# Patient Record
Sex: Female | Born: 1975 | Race: Black or African American | Hispanic: No | Smoking: Never smoker
Health system: Southern US, Community
[De-identification: ages and names within clinical notes are randomized; demographics above are authoritative.]

## PROBLEM LIST (undated history)

## (undated) ENCOUNTER — Inpatient Hospital Stay (HOSPITAL_COMMUNITY): Payer: Self-pay

## (undated) DIAGNOSIS — Z789 Other specified health status: Secondary | ICD-10-CM

## (undated) HISTORY — PX: NO PAST SURGERIES: SHX2092

---

## 2009-09-07 ENCOUNTER — Emergency Department (HOSPITAL_COMMUNITY): Admission: EM | Admit: 2009-09-07 | Discharge: 2009-09-07 | Payer: Self-pay | Admitting: Emergency Medicine

## 2010-04-23 ENCOUNTER — Emergency Department (HOSPITAL_COMMUNITY)
Admission: EM | Admit: 2010-04-23 | Discharge: 2010-04-24 | Payer: Self-pay | Source: Home / Self Care | Admitting: Emergency Medicine

## 2010-05-01 LAB — DIFFERENTIAL
Basophils Absolute: 0 10*3/uL (ref 0.0–0.1)
Basophils Relative: 0 % (ref 0–1)
Eosinophils Absolute: 0.1 10*3/uL (ref 0.0–0.7)
Eosinophils Relative: 1 % (ref 0–5)
Lymphocytes Relative: 10 % — ABNORMAL LOW (ref 12–46)
Lymphs Abs: 1.2 10*3/uL (ref 0.7–4.0)
Monocytes Absolute: 1.4 10*3/uL — ABNORMAL HIGH (ref 0.1–1.0)
Monocytes Relative: 12 % (ref 3–12)
Neutro Abs: 8.8 10*3/uL — ABNORMAL HIGH (ref 1.7–7.7)
Neutrophils Relative %: 77 % (ref 43–77)

## 2010-05-01 LAB — POCT I-STAT, CHEM 8
BUN: 8 mg/dL (ref 6–23)
Calcium, Ion: 1.11 mmol/L — ABNORMAL LOW (ref 1.12–1.32)
Chloride: 105 mEq/L (ref 96–112)
Creatinine, Ser: 0.8 mg/dL (ref 0.4–1.2)
Glucose, Bld: 113 mg/dL — ABNORMAL HIGH (ref 70–99)
HCT: 34 % — ABNORMAL LOW (ref 36.0–46.0)
Hemoglobin: 11.6 g/dL — ABNORMAL LOW (ref 12.0–15.0)
Potassium: 3.2 mEq/L — ABNORMAL LOW (ref 3.5–5.1)
Sodium: 139 mEq/L (ref 135–145)
TCO2: 26 mmol/L (ref 0–100)

## 2010-05-01 LAB — CBC
HCT: 32.7 % — ABNORMAL LOW (ref 36.0–46.0)
Hemoglobin: 11.2 g/dL — ABNORMAL LOW (ref 12.0–15.0)
MCH: 31.4 pg (ref 26.0–34.0)
MCHC: 34.3 g/dL (ref 30.0–36.0)
MCV: 91.6 fL (ref 78.0–100.0)
Platelets: 311 10*3/uL (ref 150–400)
RBC: 3.57 MIL/uL — ABNORMAL LOW (ref 3.87–5.11)
RDW: 12.1 % (ref 11.5–15.5)
WBC: 11.4 10*3/uL — ABNORMAL HIGH (ref 4.0–10.5)

## 2010-05-01 LAB — URINALYSIS, ROUTINE W REFLEX MICROSCOPIC
Bilirubin Urine: NEGATIVE
Ketones, ur: NEGATIVE mg/dL
Nitrite: POSITIVE — AB
Protein, ur: 100 mg/dL — AB
Specific Gravity, Urine: 1.022 (ref 1.005–1.030)
Urine Glucose, Fasting: NEGATIVE mg/dL
Urobilinogen, UA: 1 mg/dL (ref 0.0–1.0)
pH: 6 (ref 5.0–8.0)

## 2010-05-01 LAB — URINE MICROSCOPIC-ADD ON

## 2010-05-01 LAB — URINE CULTURE
Colony Count: >=100000
Culture  Setup Time: 201201091230

## 2010-07-03 LAB — URINALYSIS, ROUTINE W REFLEX MICROSCOPIC
Bilirubin Urine: NEGATIVE
Glucose, UA: NEGATIVE mg/dL
Ketones, ur: NEGATIVE mg/dL
Leukocytes, UA: NEGATIVE
Nitrite: NEGATIVE
Protein, ur: NEGATIVE mg/dL
Specific Gravity, Urine: 1.013 (ref 1.005–1.030)
Urobilinogen, UA: 0.2 mg/dL (ref 0.0–1.0)
pH: 7.5 (ref 5.0–8.0)

## 2010-07-03 LAB — MALARIA SMEAR

## 2010-07-03 LAB — BASIC METABOLIC PANEL
BUN: 9 mg/dL (ref 6–23)
CO2: 24 mEq/L (ref 19–32)
Calcium: 8.7 mg/dL (ref 8.4–10.5)
Chloride: 104 mEq/L (ref 96–112)
Creatinine, Ser: 0.64 mg/dL (ref 0.4–1.2)
GFR calc Af Amer: >60 mL/min (ref >60)
GFR calc non Af Amer: >60 mL/min (ref >60)
Glucose, Bld: 107 mg/dL — ABNORMAL HIGH (ref 70–99)
Potassium: 3.7 mEq/L (ref 3.5–5.1)
Sodium: 134 mEq/L — ABNORMAL LOW (ref 135–145)

## 2010-07-03 LAB — DIFFERENTIAL
Basophils Absolute: 0 10*3/uL (ref 0.0–0.1)
Basophils Relative: 0 % (ref 0–1)
Eosinophils Absolute: 0.1 10*3/uL (ref 0.0–0.7)
Eosinophils Relative: 1 % (ref 0–5)
Lymphocytes Relative: 7 % — ABNORMAL LOW (ref 12–46)
Lymphs Abs: 0.6 10*3/uL — ABNORMAL LOW (ref 0.7–4.0)
Monocytes Absolute: 0.2 10*3/uL (ref 0.1–1.0)
Monocytes Relative: 2 % — ABNORMAL LOW (ref 3–12)
Neutro Abs: 7.2 10*3/uL (ref 1.7–7.7)
Neutrophils Relative %: 90 % — ABNORMAL HIGH (ref 43–77)

## 2010-07-03 LAB — CBC
HCT: 38.1 % (ref 36.0–46.0)
Hemoglobin: 13.3 g/dL (ref 12.0–15.0)
MCHC: 35 g/dL (ref 30.0–36.0)
MCV: 94.9 fL (ref 78.0–100.0)
Platelets: 152 10*3/uL (ref 150–400)
RBC: 4.01 MIL/uL (ref 3.87–5.11)
RDW: 12.7 % (ref 11.5–15.5)
WBC: 8 10*3/uL (ref 4.0–10.5)

## 2010-07-03 LAB — POCT PREGNANCY, URINE: Preg Test, Ur: NEGATIVE

## 2010-07-03 LAB — URINE MICROSCOPIC-ADD ON

## 2010-10-06 ENCOUNTER — Inpatient Hospital Stay (HOSPITAL_COMMUNITY): Payer: Medicaid Other

## 2010-10-06 ENCOUNTER — Inpatient Hospital Stay (HOSPITAL_COMMUNITY)
Admission: AD | Admit: 2010-10-06 | Discharge: 2010-10-06 | Disposition: A | Discharge status: Home / Self Care | Payer: Medicaid Other | Source: Ambulatory Visit | Attending: Obstetrics & Gynecology | Admitting: Obstetrics & Gynecology

## 2010-10-06 DIAGNOSIS — N949 Unspecified condition associated with female genital organs and menstrual cycle: Secondary | ICD-10-CM

## 2010-10-06 DIAGNOSIS — N912 Amenorrhea, unspecified: Secondary | ICD-10-CM

## 2010-10-06 LAB — CBC
HCT: 35.7 % — ABNORMAL LOW (ref 36.0–46.0)
Hemoglobin: 12.1 g/dL (ref 12.0–15.0)
MCH: 31.5 pg (ref 26.0–34.0)
MCHC: 33.9 g/dL (ref 30.0–36.0)
MCV: 93 fL (ref 78.0–100.0)
Platelets: 213 10*3/uL (ref 150–400)
RBC: 3.84 MIL/uL — ABNORMAL LOW (ref 3.87–5.11)
RDW: 12.4 % (ref 11.5–15.5)
WBC: 4.8 10*3/uL (ref 4.0–10.5)

## 2010-10-06 LAB — URINALYSIS, ROUTINE W REFLEX MICROSCOPIC
Bilirubin Urine: NEGATIVE
Glucose, UA: NEGATIVE mg/dL
Hgb urine dipstick: NEGATIVE
Ketones, ur: NEGATIVE mg/dL
Leukocytes, UA: NEGATIVE
Nitrite: NEGATIVE
Protein, ur: NEGATIVE mg/dL
Specific Gravity, Urine: 1.01 (ref 1.005–1.030)
Urobilinogen, UA: 0.2 mg/dL (ref 0.0–1.0)
pH: 7.5 (ref 5.0–8.0)

## 2010-10-06 LAB — WET PREP, GENITAL
Trich, Wet Prep: NONE SEEN
WBC, Wet Prep HPF POC: NONE SEEN
Yeast Wet Prep HPF POC: NONE SEEN

## 2010-10-06 LAB — HCG, QUANTITATIVE, PREGNANCY: hCG, Beta Chain, Quant, S: <1 m[IU]/mL (ref <5)

## 2010-10-06 LAB — POCT PREGNANCY, URINE: Preg Test, Ur: POSITIVE

## 2010-10-06 LAB — ABO/RH: ABO/RH(D): O POS

## 2010-10-07 LAB — GC/CHLAMYDIA PROBE AMP, GENITAL
Chlamydia, DNA Probe: NEGATIVE
GC Probe Amp, Genital: NEGATIVE

## 2011-01-02 ENCOUNTER — Inpatient Hospital Stay (INDEPENDENT_AMBULATORY_CARE_PROVIDER_SITE_OTHER)
Admission: RE | Admit: 2011-01-02 | Discharge: 2011-01-02 | Disposition: A | Discharge status: Home / Self Care | Payer: Self-pay | Source: Ambulatory Visit | Attending: Family Medicine | Admitting: Family Medicine

## 2011-01-02 DIAGNOSIS — R51 Headache: Secondary | ICD-10-CM

## 2011-01-02 DIAGNOSIS — R197 Diarrhea, unspecified: Secondary | ICD-10-CM

## 2011-03-24 ENCOUNTER — Emergency Department (HOSPITAL_COMMUNITY)
Admission: EM | Admit: 2011-03-24 | Discharge: 2011-03-24 | Disposition: A | Discharge status: Home / Self Care | Payer: Medicaid Other | Attending: Emergency Medicine | Admitting: Emergency Medicine

## 2011-03-24 ENCOUNTER — Encounter: Payer: Self-pay | Admitting: *Deleted

## 2011-03-24 DIAGNOSIS — S61209A Unspecified open wound of unspecified finger without damage to nail, initial encounter: Secondary | ICD-10-CM | POA: Insufficient documentation

## 2011-03-24 DIAGNOSIS — X58XXXA Exposure to other specified factors, initial encounter: Secondary | ICD-10-CM | POA: Insufficient documentation

## 2011-03-24 DIAGNOSIS — R110 Nausea: Secondary | ICD-10-CM | POA: Insufficient documentation

## 2011-03-24 DIAGNOSIS — R51 Headache: Secondary | ICD-10-CM | POA: Insufficient documentation

## 2011-03-24 MED ORDER — DEXAMETHASONE SODIUM PHOSPHATE 10 MG/ML IJ SOLN
10.0000 mg | Freq: Once | INTRAMUSCULAR | Status: AC
  Administered 2011-03-24: 10 mg via INTRAMUSCULAR
  Filled 2011-03-24: qty 1

## 2011-03-24 MED ORDER — METOCLOPRAMIDE HCL 5 MG/ML IJ SOLN
10.0000 mg | Freq: Once | INTRAMUSCULAR | Status: AC
  Administered 2011-03-24: 10 mg via INTRAMUSCULAR
  Filled 2011-03-24: qty 2

## 2011-03-24 MED ORDER — DIPHENHYDRAMINE HCL 50 MG/ML IJ SOLN
25.0000 mg | Freq: Once | INTRAMUSCULAR | Status: AC
  Administered 2011-03-24: 25 mg via INTRAMUSCULAR
  Filled 2011-03-24: qty 1

## 2011-03-24 NOTE — ED Provider Notes (Signed)
Signout received from Jerral Ralph, PA-C at 6:40 AM. Patient presents with diffuse headache consistence with migraine. No red flags finding concerning for meningitis. Patient is currently receiving a migraine cocktail. Patient will be monitoring for relieving her symptoms in the ED. Patient has stable vital signs and afebrile.  7:08 AM On reevaluation, patient states she felt much better after the migraine cocktail. She has no fever, no nuchal rigidity, and no other red flags finding. She is able to tolerate by mouth. She'll be discharged.  Fayrene Helper, [place] 03/24/11 [phone]

## 2011-03-24 NOTE — ED Provider Notes (Signed)
History     CSN: [id number] Arrival date & time: 03/24/2011  3:05 AM   First MD Initiated Contact with Patient 03/24/11 [phone]      Chief Complaint  Patient presents with  . Headache    HPI  History provided by the patient. Patient presents with complaints of a headache that began yesterday has been waxing and waning since that time. Patient has not taken anything for her headache. Patient reports similar headaches in the past and states that she gets these when she does not feel well. Tonight patient states pain has prevented her from falling asleep. Pain is around the whole head. Patient denies any aggravating factors. She denies photophobia. She does report having a left fingernail injury to over thumb. She states she is unsure of the pain from that is causing her headache to be worse. There is no vomiting. She denies fever chills or sweats. Patient has no significant past medical history.   History reviewed. No pertinent past medical history.  History reviewed. No pertinent past surgical history.  History reviewed. No pertinent family history.  History  Substance Use Topics  . Smoking status: Not on file  . Smokeless tobacco: Not on file  . Alcohol Use: Not on file    OB History    Grav Para Term Preterm Abortions TAB SAB Ect Mult Living                  Review of Systems  Constitutional: Negative for fever and chills.  HENT: Negative for congestion and sinus pressure.   Respiratory: Negative for cough.   Gastrointestinal: Positive for nausea. Negative for vomiting and diarrhea.  All other systems reviewed and are negative.    Allergies  Review of patient's allergies indicates no known allergies.  Home Medications  No current outpatient prescriptions on file.  BP 111/69  Pulse 66  Temp(Src) 98 F (36.7 C) (Oral)  Resp 15  SpO2 100%  Physical Exam  Nursing note and vitals reviewed. Constitutional: She is oriented to person, place, and time. She appears  well-developed and well-nourished. No distress.  HENT:  Head: Normocephalic and atraumatic.  Mouth/Throat: Oropharynx is clear and moist.  Eyes: Conjunctivae and EOM are normal. Pupils are equal, round, and reactive to light.  Neck: Normal range of motion. Neck supple.  Cardiovascular: Normal rate, regular rhythm and normal heart sounds.   Pulmonary/Chest: Effort normal and breath sounds normal. She has no wheezes. She has no rales.  Abdominal: Soft. There is no tenderness.  Musculoskeletal: Normal range of motion. She exhibits no edema and no tenderness.       Partial avulsion of the left first fingernail from nailbed.  Neurological: She is alert and oriented to person, place, and time. She has normal strength. No cranial nerve deficit or sensory deficit. Gait normal.  Skin: Skin is warm. No rash noted.  Psychiatric: She has a normal mood and affect. Her behavior is normal.    ED Course  Procedures (including critical care time)   1. Headache      MDM  3:50 AM patient seen and evaluated. Patient in no acute distress. She has normal nonfocal neuro exam.  6:00AM  Pt signed out to [employer].  Pt awaiting tx for HA and reassessment. No red flags for HA.      Angus Seller, [place] 03/24/11 [phone]

## 2011-03-24 NOTE — ED Notes (Signed)
Pt tried to stand for vitals...the patient stated she became dizzy.  Pt back in bed, rails up, pt secure

## 2011-03-24 NOTE — ED Provider Notes (Signed)
Medical screening examination/treatment/procedure(s) were performed by non-physician practitioner and as supervising physician I was immediately available for consultation/collaboration.   Lamine Laton, MD 03/24/11 1551 

## 2011-03-24 NOTE — ED Notes (Signed)
Pt resting quietly with eyes closed, pt is easily arousable. Pt denies any pain or complaints at this time. Family at bedside, will continue to monitor pt. MD will be made aware of decrease pain.

## 2011-03-24 NOTE — ED Notes (Signed)
Per EMS:  Pt c/o HA and dizziness on standing since yesterday.  States she is nauseous, no vomiting/diarrhea/abdominal pain.  Normal CBG.

## 2011-03-25 NOTE — ED Provider Notes (Signed)
Medical screening examination/treatment/procedure(s) were performed by non-physician practitioner and as supervising physician I was immediately available for consultation/collaboration.   Zhaniya Swallows, MD 03/25/11 0725 

## 2012-01-11 IMAGING — US US PELVIS COMPLETE
1 series · 14 of 25 positions shown · non-contrast
Comparison: None.

CLINICAL DATA: Dizziness with headache.  Abdominal and pelvic
pain for 3 days.  Quantitative beta HCG < 1.

TRANSABDOMINAL AND TRANSVAGINAL ULTRASOUND OF PELVIS
TECHNIQUE: Both transabdominal and transvaginal ultrasound
examinations of the pelvis were performed. Transabdominal technique
was performed for global imaging of the pelvis including uterus,
ovaries, adnexal regions, and pelvic cul-de-sac.

[Series 1: us ob comp less 14 wks · 49 acquisitions, 14 frames shown]
[im 1/49]
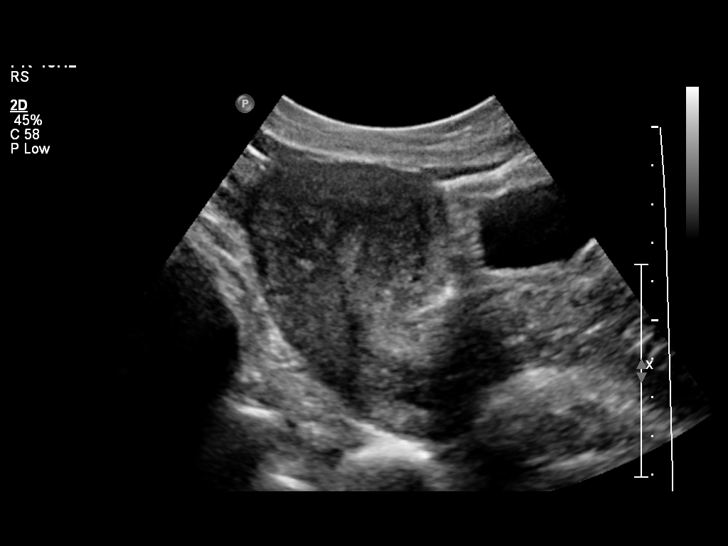
[im 5/49]
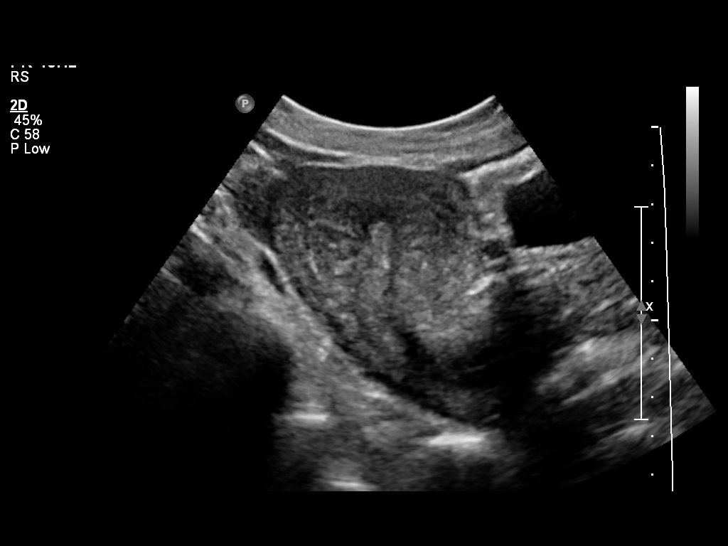
[im 9/49]
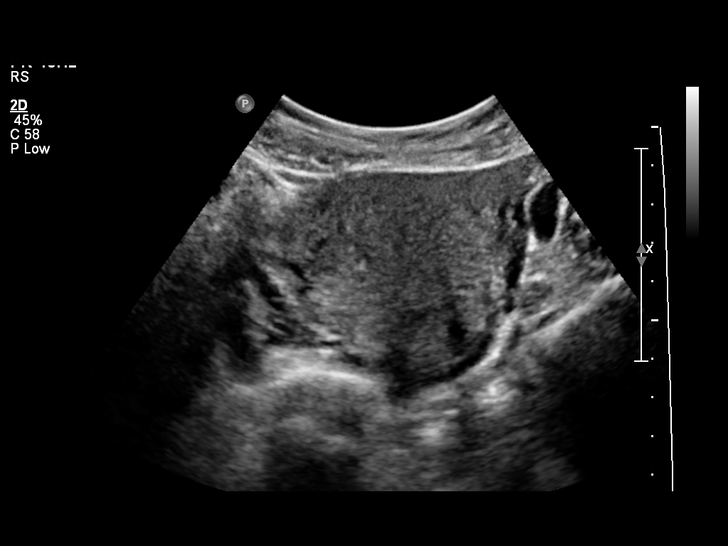
[im 13/49]
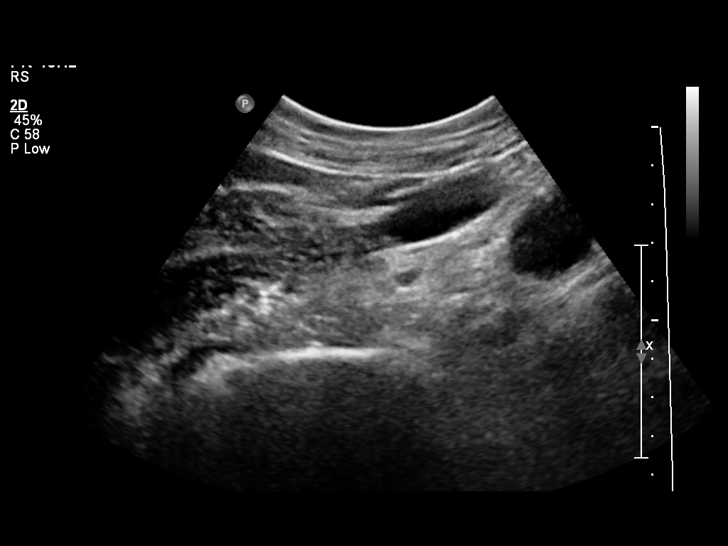
[im 17/49]
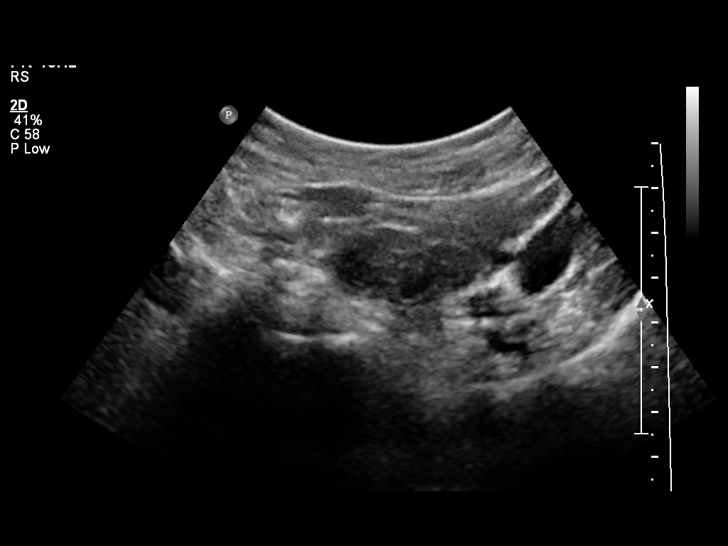
[im 19/49]
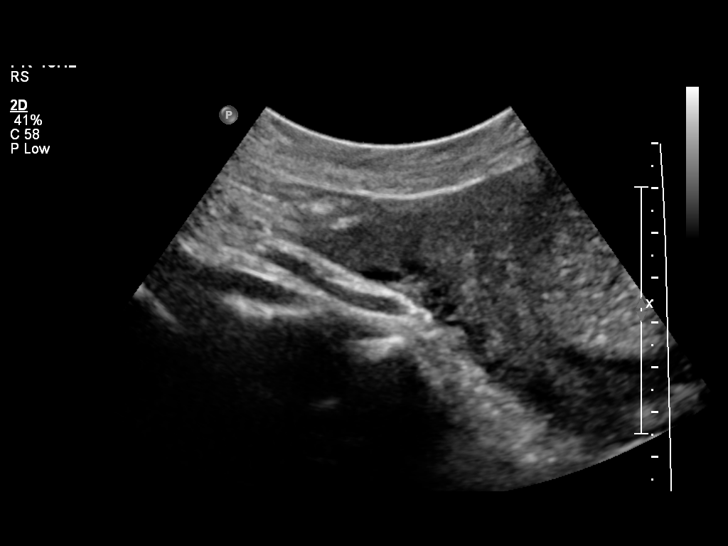
[im 23/49]
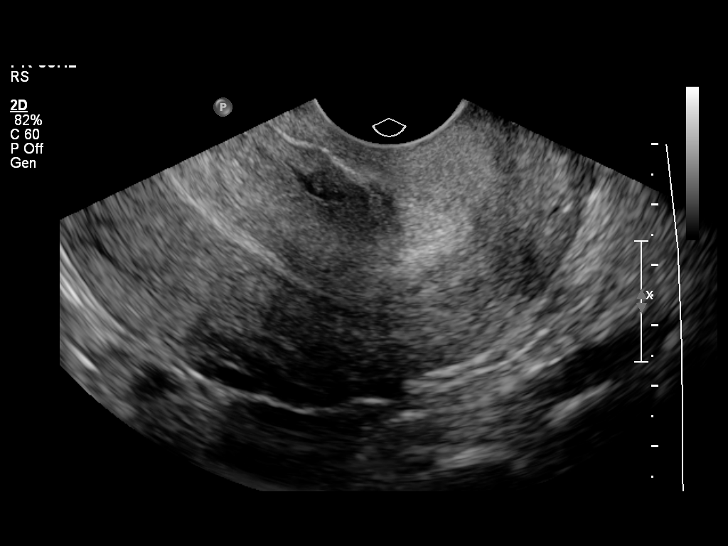
[im 27/49]
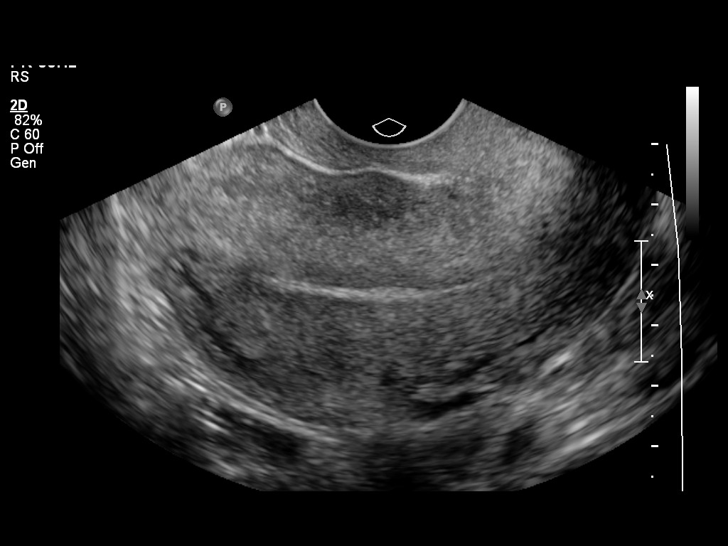
[im 31/49]
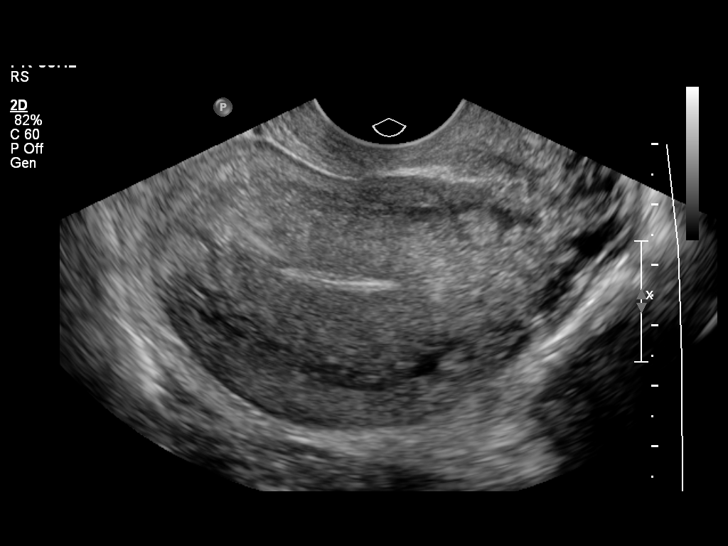
[im 33/49]
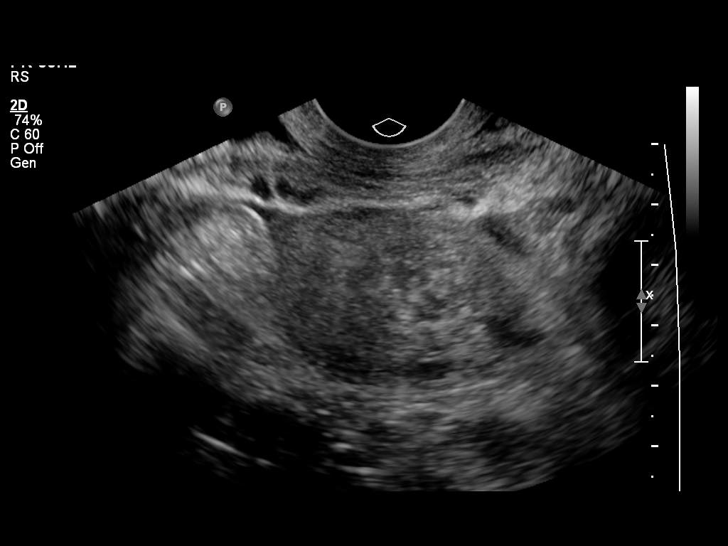
[im 37/49]
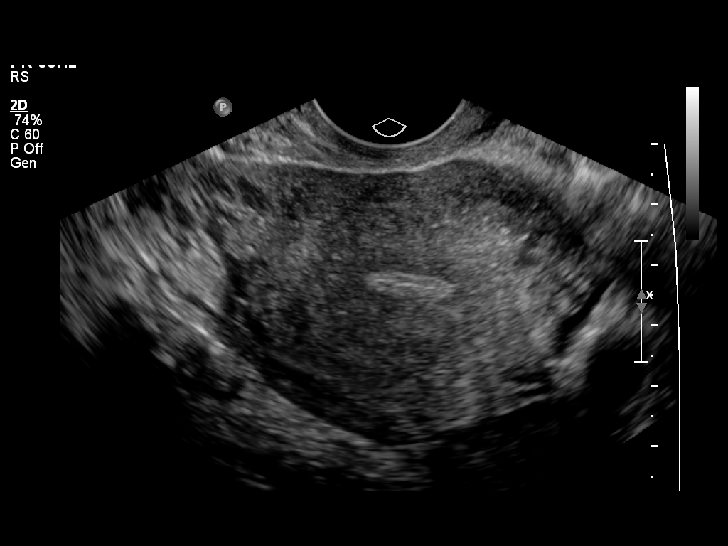
[im 41/49]
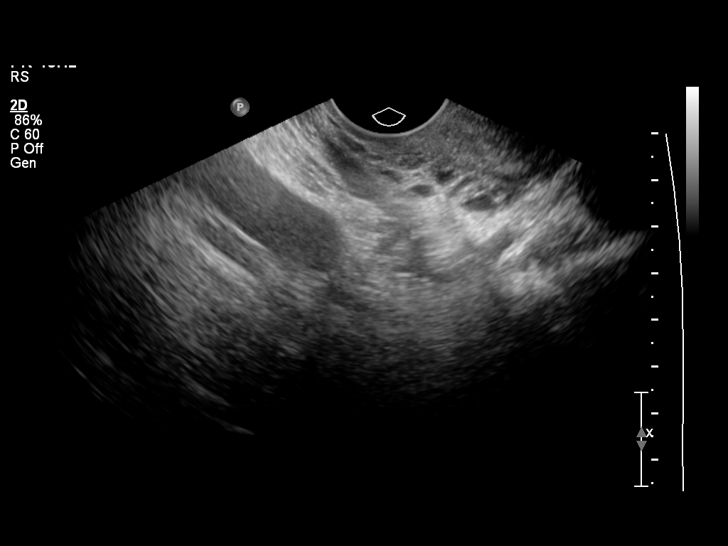
[im 45/49]
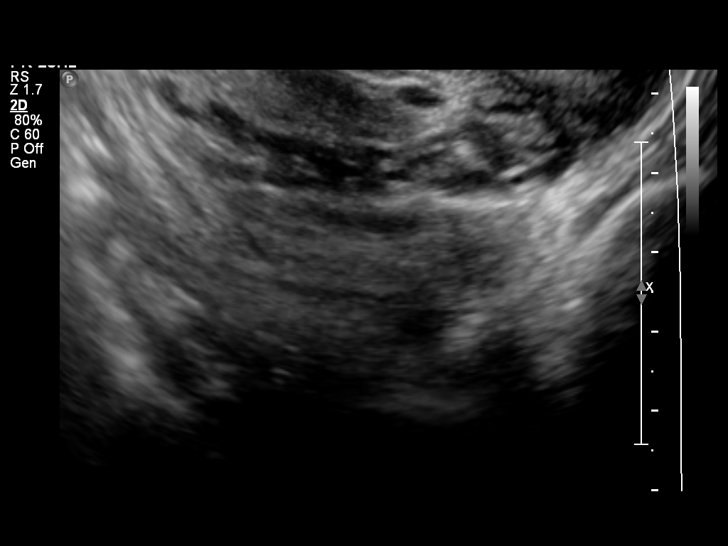
[im 49/49]
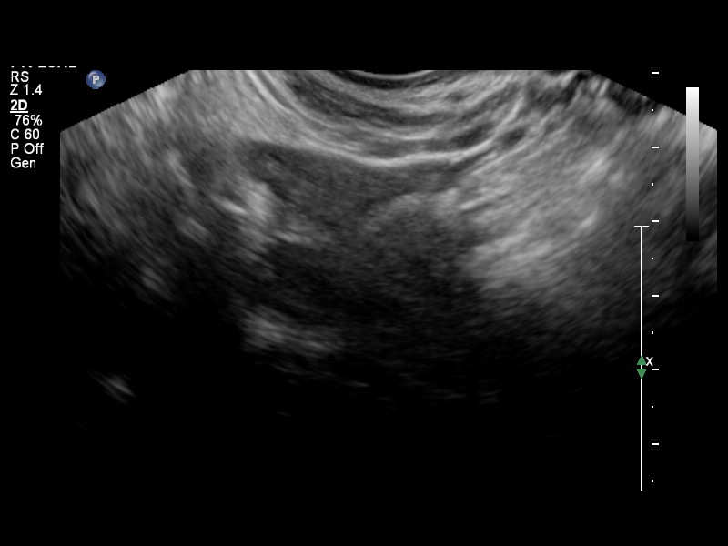

[14 of 25 positions shown; findings below may reference images not displayed]

It was necessary to proceed with endovaginal exam following the
transabdomnial exam to visualize the endometrium and adnexa.
FINDINGS: Uterus: demonstrates a sagittal length of 7.9 cm, AP depth of
cm and a transverse width of 5.0 cm. No definite focal mural
abnormalities are seen.

Endometrium: Is thin and echogenic with an AP width of 1.7 mm.  No
areas of focal thickening or heterogeneity are seen.

Right ovary:  Could not be seen with confidence either
transabdominally or endovaginally

Left ovary: Has a normal appearance measuring 2.4 x 3.6 x 1.2 cm.

Other findings: No pelvic fluid is seen.
IMPRESSION: Normal uterine myometrium and thin endometrial stripe.

Normal left ovary and non-visualized right ovary.

## 2012-07-02 ENCOUNTER — Emergency Department (HOSPITAL_COMMUNITY): Payer: Self-pay

## 2012-07-02 ENCOUNTER — Encounter (HOSPITAL_COMMUNITY): Payer: Self-pay | Admitting: *Deleted

## 2012-07-02 ENCOUNTER — Emergency Department (HOSPITAL_COMMUNITY)
Admission: EM | Admit: 2012-07-02 | Discharge: 2012-07-02 | Disposition: A | Discharge status: Home / Self Care | Payer: Self-pay | Attending: Emergency Medicine | Admitting: Emergency Medicine

## 2012-07-02 DIAGNOSIS — Z3202 Encounter for pregnancy test, result negative: Secondary | ICD-10-CM | POA: Insufficient documentation

## 2012-07-02 DIAGNOSIS — R109 Unspecified abdominal pain: Secondary | ICD-10-CM | POA: Insufficient documentation

## 2012-07-02 LAB — CBC WITH DIFFERENTIAL/PLATELET
Basophils Relative: 0 % (ref 0–1)
Eosinophils Absolute: 0.2 10*3/uL (ref 0.0–0.7)
Eosinophils Relative: 3 % (ref 0–5)
HCT: 35.9 % — ABNORMAL LOW (ref 36.0–46.0)
Lymphs Abs: 1.5 10*3/uL (ref 0.7–4.0)
MCHC: 34.5 g/dL (ref 30.0–36.0)
Monocytes Relative: 9 % (ref 3–12)
Platelets: 210 10*3/uL (ref 150–400)
WBC: 5.2 10*3/uL (ref 4.0–10.5)

## 2012-07-02 LAB — PREGNANCY, URINE: Preg Test, Ur: NEGATIVE

## 2012-07-02 LAB — URINALYSIS, ROUTINE W REFLEX MICROSCOPIC
Leukocytes, UA: NEGATIVE
Nitrite: NEGATIVE
Specific Gravity, Urine: 1.012 (ref 1.005–1.030)
pH: 5.5 (ref 5.0–8.0)

## 2012-07-02 LAB — COMPREHENSIVE METABOLIC PANEL
Albumin: 3.3 g/dL — ABNORMAL LOW (ref 3.5–5.2)
BUN: 12 mg/dL (ref 6–23)
Calcium: 8.5 mg/dL (ref 8.4–10.5)
GFR calc Af Amer: >90 mL/min (ref >90)
Glucose, Bld: 101 mg/dL — ABNORMAL HIGH (ref 70–99)
Total Protein: 6.6 g/dL (ref 6.0–8.3)

## 2012-07-02 LAB — URINE MICROSCOPIC-ADD ON

## 2012-07-02 MED ORDER — ONDANSETRON HCL 4 MG/2ML IJ SOLN
4.0000 mg | Freq: Once | INTRAMUSCULAR | Status: AC
  Administered 2012-07-02: 4 mg via INTRAVENOUS
  Filled 2012-07-02: qty 2

## 2012-07-02 MED ORDER — HYDROCODONE-ACETAMINOPHEN 5-325 MG PO TABS: 1.0000 | ORAL_TABLET | Freq: Four times a day (QID) | ORAL | Status: DC | PRN

## 2012-07-02 MED ORDER — HYDROMORPHONE HCL PF 1 MG/ML IJ SOLN
1.0000 mg | Freq: Once | INTRAMUSCULAR | Status: AC
  Administered 2012-07-02: 1 mg via INTRAVENOUS
  Filled 2012-07-02: qty 1

## 2012-07-02 NOTE — ED Provider Notes (Signed)
History     CSN: [id number]  Arrival date & time 07/02/12  4098   First MD Initiated Contact with Patient 07/02/12 [phone]      Chief Complaint  Patient presents with  . Abdominal Pain    (Consider location/radiation/quality/duration/timing/severity/associated sxs/prior treatment) Patient is a 37 y.o. female presenting with abdominal pain. The history is provided by the patient (pt complains of lower abd pain). No language interpreter was used.  Abdominal Pain Pain location:  Suprapubic Pain quality: aching   Pain radiates to:  Does not radiate Pain severity:  Moderate Onset quality:  Gradual Timing:  Constant Progression:  Waxing and waning Context: not alcohol use   Associated symptoms: no chest pain, no cough, no diarrhea, no fatigue and no hematuria     History reviewed. No pertinent past medical history.  History reviewed. No pertinent past surgical history.  History reviewed. No pertinent family history.  History  Substance Use Topics  . Smoking status: Never Smoker   . Smokeless tobacco: Not on file  . Alcohol Use: No    OB History   Grav Para Term Preterm Abortions TAB SAB Ect Mult Living                  Review of Systems  Constitutional: Negative for fatigue.  HENT: Negative for congestion, sinus pressure and ear discharge.   Eyes: Negative for discharge.  Respiratory: Negative for cough.   Cardiovascular: Negative for chest pain.  Gastrointestinal: Positive for abdominal pain. Negative for diarrhea.  Genitourinary: Negative for frequency and hematuria.  Musculoskeletal: Negative for back pain.  Skin: Negative for rash.  Neurological: Negative for seizures and headaches.  Psychiatric/Behavioral: Negative for hallucinations.    Allergies  Review of patient's allergies indicates no known allergies.  Home Medications   Current Outpatient Rx  Name  Route  Sig  Dispense  Refill  . Acetaminophen-Caff-Pyrilamine (MIDOL COMPLETE PO)   Oral   Take 1  tablet by mouth every 6 (six) hours as needed (pain).         Marland Kitchen HYDROcodone-acetaminophen (NORCO/VICODIN) 5-325 MG per tablet   Oral   Take 1 tablet by mouth every 6 (six) hours as needed for pain.   20 tablet   0     BP 102/64  Pulse 63  Temp(Src) 98.3 F (36.8 C) (Oral)  Resp 14  SpO2 100%  LMP 07/01/2012  Physical Exam  Constitutional: She is oriented to person, place, and time. She appears well-developed.  HENT:  Head: Normocephalic and atraumatic.  Eyes: Conjunctivae and EOM are normal. No scleral icterus.  Neck: Neck supple. No thyromegaly present.  Cardiovascular: Normal rate and regular rhythm.  Exam reveals no gallop and no friction rub.   No murmur heard. Pulmonary/Chest: No stridor. She has no wheezes. She has no rales. She exhibits no tenderness.  Abdominal: She exhibits no distension. There is tenderness. There is no rebound.  Pt has tenderness suprapubic  Musculoskeletal: Normal range of motion. She exhibits no edema.  Lymphadenopathy:    She has no cervical adenopathy.  Neurological: She is oriented to person, place, and time. Coordination normal.  Skin: No rash noted. No erythema.  Psychiatric: She has a normal mood and affect. Her behavior is normal.    ED Course  Procedures (including critical care time)  Labs Reviewed  CBC WITH DIFFERENTIAL - Abnormal; Notable for the following:    HCT 35.9 (*)    All other components within normal limits  COMPREHENSIVE METABOLIC PANEL -  Abnormal; Notable for the following:    Sodium 133 (*)    Glucose, Bld 101 (*)    Albumin 3.3 (*)    All other components within normal limits  URINALYSIS, ROUTINE W REFLEX MICROSCOPIC - Abnormal; Notable for the following:    APPearance CLOUDY (*)    Hgb urine dipstick MODERATE (*)    All other components within normal limits  PREGNANCY, URINE  URINE MICROSCOPIC-ADD ON   Dg Abd Acute W/chest  07/02/2012  *RADIOLOGY REPORT*  Clinical Data: Right lower quadrant abdominal  pain.  ACUTE ABDOMEN SERIES (ABDOMEN 2 VIEW & CHEST 1 VIEW)  Comparison: Chest x-ray 09/07/2009.  Findings: Lung volumes are normal.  No consolidative airspace disease.  No pleural effusions.  No pneumothorax.  No pulmonary nodule or mass noted.  Pulmonary vasculature and the cardiomediastinal silhouette are within normal limits.  Gas and stool are seen scattered throughout the colon extending to the level of the distal rectum.  No pathologic distension of small bowel is noted.  No gross evidence of pneumoperitoneum.  IMPRESSION: 1.  Nonobstructive bowel gas pattern. 2.  No pneumoperitoneum. 3.  No radiographic evidence of acute cardiopulmonary disease.   Original Report Authenticated By: Trudie Reed, M.D.      1. Abdominal pain       MDM          Benny Lennert, MD 07/02/12 [phone]

## 2012-07-02 NOTE — ED Notes (Signed)
Pt states that about last night around 12 midnight she began to have severe ABD pain but denies vomitting and states that she only had diarrhea once on this past Sunday. States that she is unalble to eat or drink due to the pain.  States that she took midol for the pain but with no relief.

## 2013-11-20 ENCOUNTER — Inpatient Hospital Stay (HOSPITAL_COMMUNITY): Payer: Medicaid Other

## 2013-11-20 ENCOUNTER — Encounter (HOSPITAL_COMMUNITY): Payer: Self-pay | Admitting: *Deleted

## 2013-11-20 ENCOUNTER — Inpatient Hospital Stay (HOSPITAL_COMMUNITY)
Admission: AD | Admit: 2013-11-20 | Discharge: 2013-11-20 | Disposition: A | Discharge status: Home / Self Care | Payer: Medicaid Other | Source: Ambulatory Visit | Attending: Obstetrics & Gynecology | Admitting: Obstetrics & Gynecology

## 2013-11-20 DIAGNOSIS — A599 Trichomoniasis, unspecified: Secondary | ICD-10-CM

## 2013-11-20 DIAGNOSIS — O98819 Other maternal infectious and parasitic diseases complicating pregnancy, unspecified trimester: Secondary | ICD-10-CM | POA: Insufficient documentation

## 2013-11-20 DIAGNOSIS — R109 Unspecified abdominal pain: Secondary | ICD-10-CM | POA: Insufficient documentation

## 2013-11-20 DIAGNOSIS — R51 Headache: Secondary | ICD-10-CM | POA: Diagnosis present

## 2013-11-20 DIAGNOSIS — A5901 Trichomonal vulvovaginitis: Secondary | ICD-10-CM | POA: Insufficient documentation

## 2013-11-20 DIAGNOSIS — O26899 Other specified pregnancy related conditions, unspecified trimester: Secondary | ICD-10-CM

## 2013-11-20 DIAGNOSIS — G44209 Tension-type headache, unspecified, not intractable: Secondary | ICD-10-CM

## 2013-11-20 HISTORY — DX: Other specified health status: Z78.9

## 2013-11-20 LAB — URINALYSIS, ROUTINE W REFLEX MICROSCOPIC
Bilirubin Urine: NEGATIVE
Glucose, UA: NEGATIVE mg/dL
Hgb urine dipstick: NEGATIVE
Ketones, ur: 15 mg/dL — AB
LEUKOCYTES UA: NEGATIVE
NITRITE: NEGATIVE
PH: 6 (ref 5.0–8.0)
Protein, ur: NEGATIVE mg/dL
SPECIFIC GRAVITY, URINE: 1.025 (ref 1.005–1.030)
UROBILINOGEN UA: 0.2 mg/dL (ref 0.0–1.0)

## 2013-11-20 LAB — CBC
HCT: 35.8 % — ABNORMAL LOW (ref 36.0–46.0)
Hemoglobin: 12.4 g/dL (ref 12.0–15.0)
MCH: 32 pg (ref 26.0–34.0)
MCHC: 34.6 g/dL (ref 30.0–36.0)
MCV: 92.5 fL (ref 78.0–100.0)
PLATELETS: 188 10*3/uL (ref 150–400)
RBC: 3.87 MIL/uL (ref 3.87–5.11)
RDW: 12.2 % (ref 11.5–15.5)
WBC: 7.4 10*3/uL (ref 4.0–10.5)

## 2013-11-20 LAB — WET PREP, GENITAL: Yeast Wet Prep HPF POC: NONE SEEN

## 2013-11-20 LAB — HCG, QUANTITATIVE, PREGNANCY: HCG, BETA CHAIN, QUANT, S: 102327 m[IU]/mL — AB (ref <5)

## 2013-11-20 LAB — POCT PREGNANCY, URINE: Preg Test, Ur: POSITIVE — AB

## 2013-11-20 MED ORDER — DEXAMETHASONE SODIUM PHOSPHATE 10 MG/ML IJ SOLN
10.0000 mg | Freq: Once | INTRAMUSCULAR | Status: AC
  Administered 2013-11-20: 10 mg via INTRAVENOUS
  Filled 2013-11-20: qty 1

## 2013-11-20 MED ORDER — ACETAMINOPHEN 325 MG PO TABS
650.0000 mg | ORAL_TABLET | Freq: Once | ORAL | Status: AC
  Administered 2013-11-20: 650 mg via ORAL
  Filled 2013-11-20: qty 2

## 2013-11-20 MED ORDER — METOCLOPRAMIDE HCL 5 MG/ML IJ SOLN
5.0000 mg | Freq: Once | INTRAMUSCULAR | Status: AC
  Administered 2013-11-20: 5 mg via INTRAVENOUS
  Filled 2013-11-20: qty 2

## 2013-11-20 MED ORDER — ACETAMINOPHEN-CODEINE #3 300-30 MG PO TABS: 1.0000 | ORAL_TABLET | Freq: Four times a day (QID) | ORAL | Status: AC | PRN

## 2013-11-20 MED ORDER — DIPHENHYDRAMINE HCL 50 MG/ML IJ SOLN
12.5000 mg | Freq: Once | INTRAMUSCULAR | Status: AC
  Administered 2013-11-20: 12.5 mg via INTRAVENOUS
  Filled 2013-11-20: qty 1

## 2013-11-20 MED ORDER — LACTATED RINGERS IV BOLUS (SEPSIS)
1000.0000 mL | Freq: Once | INTRAVENOUS | Status: AC
  Administered 2013-11-20: 1000 mL via INTRAVENOUS

## 2013-11-20 MED ORDER — PROMETHAZINE HCL 25 MG PO TABS
25.0000 mg | ORAL_TABLET | Freq: Once | ORAL | Status: AC
  Administered 2013-11-20: 25 mg via ORAL
  Filled 2013-11-20: qty 1

## 2013-11-20 MED ORDER — METRONIDAZOLE 500 MG PO TABS
2000.0000 mg | ORAL_TABLET | Freq: Once | ORAL | Status: AC
  Administered 2013-11-20: 2000 mg via ORAL
  Filled 2013-11-20: qty 4

## 2013-11-20 NOTE — Discharge Instructions (Signed)

## 2013-11-20 NOTE — ED Provider Notes (Cosign Needed)
History     CSN: [id number]  Arrival date and time: 11/20/13 1514  Patty Cuevas,[id number], is a 38 year old African American female who is [redacted] weeks pregnants and presents to the maternal admissions unit complaining of a severe headache and abdominal pain. She states that the headache began in the middle of last week. She states that the pain is "all over her head". She states that the pain is constant throughout the day. Patient states she has been taking Tylenol but it hasn't been helping. She rates the pain an 8 on the severity scale. The patients husband is present and states that the patient does not drink much water during the  Patient endorses abdominal pain. She locates the pain to the lower left quadrant. She describes the pain as "wave-like and happens once in a while." She rates the pain a 2 or 3 on the severity scale. She hasn't done anything to make her abdomen feel better. Patient endorses white vaginal discharge. Denies vaginal bleeding and itching. States there were no complications in her previous 4 pregnancies. She endorses nausea and fever.     Chief Complaint  Patient presents with  . Headache   Patient is a 38 y.o. female presenting with headaches. The history is provided by the patient.  Headache The primary symptoms include headaches, fever and nausea. Primary symptoms do not include syncope, dizziness, visual change, paresthesias, loss of sensation, speech change or memory loss. The symptoms began 5 to 7 days ago. The symptoms are unchanged. The neurological symptoms are diffuse. The symptoms occurred during pregnancy.  The headache began more than 2 days ago. The headache developed gradually. Headache is a new problem. The headache is present continuously. The pain from the headache is at a severity of 8/10. Location/region(s) of the headache: left unilateral, right unilateral, bilateral, frontal, temporal, parietal, occipital and right periorbital. The headache is  associated with nothing. The headache is not associated with aura, double vision, eye pain, visual change, paresthesias or weakness.  Additional symptoms do not include weakness or aura. Medical issues do not include cerebral vascular accident, cancer, drug use or hypertension.      Past Medical History  Diagnosis Date  . Medical history non-contributory     Past Surgical History  Procedure Laterality Date  . No past surgeries      No family history on file.  History  Substance Use Topics  . Smoking status: Never Smoker   . Smokeless tobacco: Not on file  . Alcohol Use: No    Allergies: No Known Allergies  Prescriptions prior to admission  Medication Sig Dispense Refill  . Acetaminophen-Caff-Pyrilamine (MIDOL COMPLETE PO) Take 1 tablet by mouth every 6 (six) hours as needed (pain).      Marland Kitchen HYDROcodone-acetaminophen (NORCO/VICODIN) 5-325 MG per tablet Take 1 tablet by mouth every 6 (six) hours as needed for pain.  20 tablet  0   Results for orders placed during the hospital encounter of 11/20/13 (from the past 48 hour(s))  URINALYSIS, ROUTINE W REFLEX MICROSCOPIC     Status: Abnormal   Collection Time    11/20/13  3:30 PM      Result Value Ref Range   Color, Urine YELLOW  YELLOW   APPearance CLEAR  CLEAR   Specific Gravity, Urine 1.025  1.005 - 1.030   pH 6.0  5.0 - 8.0   Glucose, UA NEGATIVE  NEGATIVE mg/dL   Hgb urine dipstick NEGATIVE  NEGATIVE   Bilirubin Urine NEGATIVE  NEGATIVE  Ketones, ur 15 (*) NEGATIVE mg/dL   Protein, ur NEGATIVE  NEGATIVE mg/dL   Urobilinogen, UA 0.2  0.0 - 1.0 mg/dL   Nitrite NEGATIVE  NEGATIVE   Leukocytes, UA NEGATIVE  NEGATIVE   Comment: MICROSCOPIC NOT DONE ON URINES WITH NEGATIVE PROTEIN, BLOOD, LEUKOCYTES, NITRITE, OR GLUCOSE <1000 mg/dL.  POCT PREGNANCY, URINE     Status: Abnormal   Collection Time    11/20/13  3:40 PM      Result Value Ref Range   Preg Test, Ur POSITIVE (*) NEGATIVE   Comment:            THE SENSITIVITY OF  THIS     METHODOLOGY IS >24 mIU/mL  CBC     Status: Abnormal   Collection Time    11/20/13  5:45 PM      Result Value Ref Range   WBC 7.4  4.0 - 10.5 K/uL   RBC 3.87  3.87 - 5.11 MIL/uL   Hemoglobin 12.4  12.0 - 15.0 g/dL   HCT 16.1 (*) 09.6 - 04.5 %   MCV 92.5  78.0 - 100.0 fL   MCH 32.0  26.0 - 34.0 pg   MCHC 34.6  30.0 - 36.0 g/dL   RDW 40.9  81.1 - 91.4 %   Platelets 188  150 - 400 K/uL  HCG, QUANTITATIVE, PREGNANCY     Status: Abnormal   Collection Time    11/20/13  5:45 PM      Result Value Ref Range   hCG, Beta Chain, Quant, S [id number] (*) <5 mIU/mL   Comment:              GEST. AGE      CONC.  (mIU/mL)       <=1 WEEK        5 - 50         2 WEEKS       50 - 500         3 WEEKS       100 - 10,000         4 WEEKS     1,000 - 30,000         5 WEEKS     3,500 - 115,000       6-8 WEEKS     12,000 - 270,000        12 WEEKS     15,000 - 220,000                FEMALE AND NON-PREGNANT FEMALE:         LESS THAN 5 mIU/mL  WET PREP, GENITAL     Status: Abnormal   Collection Time    11/20/13  7:00 PM      Result Value Ref Range   Yeast Wet Prep HPF POC NONE SEEN  NONE SEEN   Trich, Wet Prep FEW (*) NONE SEEN   Clue Cells Wet Prep HPF POC FEW (*) NONE SEEN   WBC, Wet Prep HPF POC FEW (*) NONE SEEN   Comment: BACTERIA- TOO NUMEROUS TO COUNT   US Ob Comp Less 14 Wks  11/20/2013   CLINICAL DATA:  Headache, pelvic pain, pregnant.  EXAM: OBSTETRIC <14 WK [place] AND TRANSVAGINAL OB US  TECHNIQUE: Both transabdominal and transvaginal ultrasound examinations were performed for complete evaluation of the gestation as well as the maternal uterus, adnexal regions, and pelvic cul-de-sac. Transvaginal technique was performed to assess early pregnancy.  COMPARISON:  None.  FINDINGS:  Intrauterine gestational sac: Visualized/normal in shape.  Yolk sac:  Not identified  Embryo: Not definitively identified. There is a mixed echogenicity rounded area within the gestational sacs without appreciable fetal  heart tones, measuring 2.3 x 1.2 x 2.6 cm.  Cardiac Activity: Not identified  MSD:  33  mm   8 w   5  d  [place] EDC: 06/27/2014  Maternal uterus/adnexae: Normal sonographic appearance to the ovaries. No free fluid.  IMPRESSION: Intrauterine gestational sac contains a complex 2.3 cm rounded area which may reflect a failed pregnancy with retained products versus molar pregnancy. Recommend beta HCG correlation.   Electronically Signed   By: Jearld Lesch M.D.   On: 11/20/2013 18:31   US Ob Transvaginal  11/20/2013   CLINICAL DATA:  Headache, pelvic pain, pregnant.  EXAM: OBSTETRIC <14 WK [place] AND TRANSVAGINAL OB US  TECHNIQUE: Both transabdominal and transvaginal ultrasound examinations were performed for complete evaluation of the gestation as well as the maternal uterus, adnexal regions, and pelvic cul-de-sac. Transvaginal technique was performed to assess early pregnancy.  COMPARISON:  None.  FINDINGS: Intrauterine gestational sac: Visualized/normal in shape.  Yolk sac:  Not identified  Embryo: Not definitively identified. There is a mixed echogenicity rounded area within the gestational sacs without appreciable fetal heart tones, measuring 2.3 x 1.2 x 2.6 cm.  Cardiac Activity: Not identified  MSD:  33  mm   8 w   5  d  [place] EDC: 06/27/2014  Maternal uterus/adnexae: Normal sonographic appearance to the ovaries. No free fluid.  IMPRESSION: Intrauterine gestational sac contains a complex 2.3 cm rounded area which may reflect a failed pregnancy with retained products versus molar pregnancy. Recommend beta HCG correlation.   Electronically Signed   By: Jearld Lesch M.D.   On: 11/20/2013 18:31     Review of Systems  Constitutional: Positive for fever and chills. Negative for weight loss, malaise/fatigue and diaphoresis.  Eyes: Negative for blurred vision, double vision and pain.  Respiratory: Negative for cough, hemoptysis, sputum production, shortness of breath and wheezing.   Cardiovascular: Negative for  chest pain, palpitations and syncope.  Gastrointestinal: Positive for nausea and abdominal pain.  Neurological: Positive for headaches. Negative for dizziness, tingling, speech change, weakness and paresthesias.  Psychiatric/Behavioral: Negative for memory loss.   Physical Exam   Blood pressure 114/65, pulse 66, temperature 98.9 F (37.2 C), temperature source Oral, resp. rate 18, height 5' 2.5" (1.588 m), weight 147 lb 3.2 oz (66.769 kg), last menstrual period 10/05/2013.  Physical Exam  Constitutional: She appears well-developed and well-nourished.  HENT:  Head: Normocephalic and atraumatic.  Eyes: Conjunctivae and EOM are normal. Pupils are equal, round, and reactive to light. Right eye exhibits no discharge. Left eye exhibits no discharge. No scleral icterus.  Cardiovascular: Normal rate, regular rhythm, normal heart sounds and intact distal pulses.  Exam reveals no gallop and no friction rub.   No murmur heard. Respiratory: Effort normal and breath sounds normal. No respiratory distress. She has no wheezes. She has no rales. She exhibits no tenderness.  GI: Soft. There is no hepatomegaly. There is tenderness in the right lower quadrant and left lower quadrant. There is no rigidity and no guarding.    Speculum exam: Vagina - Small amount of creamy discharge, no odor Cervix - + contact bleeding; scant  Bimanual exam: Cervix closed, no CMT  Uterus non tender, normal size Adnexa non tender, no masses bilaterally GC/Chlam, wet prep done Chaperone present for exam.   MAU Course  Procedures None  MDM 1. Ruling out ectopic pregnancy 2.Headache; PO phenergan 25 mg given and 650 mg tylenol given PO.  3. 1900 no relief from oral medication. HA is likely due to dehydration (ketones on urine sample). Pt would like to proceed with IV fluids and IV medication for the HA.  4. Wet prep shows trichomonas; Flagyl 2 grams given in MAU. Partner given RX  5. Patient rates her HA pain 1/10 at  discharge home  6. US shows intrauterine gestational sac, no Yolk sac; questionable molar pregnancy.   Assessment and Plan   Assessment:  1. Tension-type headache, not intractable, unspecified chronicity pattern   2. Trichomonas infection   3. Abdominal pain in pregnancy; cannot rule out ectopic pregnancy    Plan:  Discharge home in stable condition RX tylenol #3 Return to MAU in 48 hours for repeat beta hcg Ectopic precautions Return to MAU if abdominal pain and/or vaginal bleeding Partner RX given for the treatment of Trichomonas.   Shearon BaloGhanem, Karim M 11/20/2013, 4:25 PM    Evaluation and management procedures were performed by the PA student under my supervision and collaboration. I have reviewed the note and chart, and I agree with the management and plan.  Iona HansenJennifer Irene Irja Wheless, NP 11/20/2013 10:56 PM    Debbrah AlarJennifer Irene Hortence Charter, NP 11/20/13 2257

## 2013-11-20 NOTE — MAU Note (Signed)
Pt reports having a headache for the past 2-3 days takes tylenol with some releif  But keeps coming back

## 2013-11-21 LAB — GC/CHLAMYDIA PROBE AMP
CT Probe RNA: NEGATIVE
GC PROBE AMP APTIMA: NEGATIVE

## 2013-11-22 ENCOUNTER — Inpatient Hospital Stay (HOSPITAL_COMMUNITY)
Admission: AD | Admit: 2013-11-22 | Discharge: 2013-11-22 | Disposition: A | Discharge status: Home / Self Care | Payer: Medicaid Other | Source: Ambulatory Visit | Attending: Obstetrics & Gynecology | Admitting: Obstetrics & Gynecology

## 2013-11-22 ENCOUNTER — Inpatient Hospital Stay (HOSPITAL_COMMUNITY): Payer: Medicaid Other

## 2013-11-22 DIAGNOSIS — O99891 Other specified diseases and conditions complicating pregnancy: Secondary | ICD-10-CM | POA: Diagnosis present

## 2013-11-22 DIAGNOSIS — O9989 Other specified diseases and conditions complicating pregnancy, childbirth and the puerperium: Principal | ICD-10-CM

## 2013-11-22 DIAGNOSIS — R1032 Left lower quadrant pain: Secondary | ICD-10-CM | POA: Diagnosis not present

## 2013-11-22 DIAGNOSIS — O0281 Inappropriate change in quantitative human chorionic gonadotropin (hCG) in early pregnancy: Secondary | ICD-10-CM

## 2013-11-22 LAB — HCG, QUANTITATIVE, PREGNANCY: hCG, Beta Chain, Quant, S: 132172 m[IU]/mL — ABNORMAL HIGH (ref <5)

## 2013-11-22 NOTE — MAU Provider Note (Signed)
History   Chief Complaint:  No chief complaint on file.   Patty Cuevas is  38 y.o. [id number] Patient's last menstrual period was 10/05/2013.Marland Kitchen. Patient is here for follow up of quantitative HCG and ongoing surveillance of pregnancy status.   She is 3746w6d weeks gestation  by LMP.  US two days ago showed a complex 2.3 cm rounded area which may reflect a failed pregnancy with retained products versus molar pregnancy.   Since her last visit, the patient is without new complaint.   The patient reports bleeding as  none now.  Mild LLQ pain.  General ROS:  Otherwise negative.   Her previous Quantitative HCG values are:  Results for Patty Cuevas, Patty (MRN [id number]) as of 11/22/2013 16:51  Ref. Range 11/20/2013 17:45  hCG, Beta Chain, Quant, S Latest Range: <5 mIU/mL 102327 (H)    Physical Exam   Last menstrual period 10/05/2013.  Focused Gynecological Exam: examination not indicated  Labs: Results for orders placed during the hospital encounter of 11/22/13 (from the past 24 hour(s))  HCG, QUANTITATIVE, PREGNANCY   Collection Time    11/22/13  5:00 PM      Result Value Ref Range   hCG, Beta Chain, Quant, [place] [id number] (*) <5 mIU/mL    Ultrasound Studies:   [place] Ob Comp Less 14 Wks  11/20/2013   CLINICAL DATA:  Headache, pelvic pain, pregnant.  EXAM: OBSTETRIC <14 WK US AND TRANSVAGINAL OB US  TECHNIQUE: Both transabdominal and transvaginal ultrasound examinations were performed for complete evaluation of the gestation as well as the maternal uterus, adnexal regions, and pelvic cul-de-sac. Transvaginal technique was performed to assess early pregnancy.  COMPARISON:  None.  FINDINGS: Intrauterine gestational sac: Visualized/normal in shape.  Yolk sac:  Not identified  Embryo: Not definitively identified. There is a mixed echogenicity rounded area within the gestational sacs without appreciable fetal heart tones, measuring 2.3 x 1.2 x 2.6 cm.  Cardiac Activity: Not identified  MSD:  33  mm   8 w    5  d  US EDC: 06/27/2014  Maternal uterus/adnexae: Normal sonographic appearance to the ovaries. No free fluid.  IMPRESSION: Intrauterine gestational sac contains a complex 2.3 cm rounded area which may reflect a failed pregnancy with retained products versus molar pregnancy. Recommend beta HCG correlation.   Electronically Signed   By: Jearld LeschAndrew  DelGaizo M.D.   On: 11/20/2013 18:31  [place] Ob Comp Less 14 Wks  [place] Ob Transvaginal  11/22/2013   CLINICAL DATA:  Question of molar pregnancy. Quantitative beta HCG is 132,172 today. On 11/20/2013 quantitative beta HCG was 102,327. By LMP the patient is 6 weeks 6 days. EDC by LMP is 07/12/2014.  EXAM: TRANSVAGINAL OB ULTRASOUND  TECHNIQUE: Transvaginal ultrasound was performed for complete evaluation of the gestation as well as the maternal uterus, adnexal regions, and pelvic cul-de-sac.  COMPARISON:  11/20/2013  FINDINGS: Intrauterine gestational sac: Present  Yolk sac:  Not definitely seen  Embryo: Solid and cystic structure within the gestational sac measures 3.5 x 1.7 x 2.8 cm. Normal embryonic anatomy is absent.  Cardiac Activity: None  MSD: 39  mm   9 w   4  d  Maternal uterus/adnexae: The ovaries have a normal appearance. No theca lutein cysts identified. No cystic degeneration of the placenta.  IMPRESSION: 1. Findings are inconsistent with a normal viable pregnancy. 2. There remains the question of molar pregnancy given the abnormal appearance of the structure within the gestational sac and the lack of normal embryonic anatomy.  3. Consider karyotype if D and C is performed. Subsequent followup quantitative beta hCGs would be recommended if positive for molar pregnancy. The findings were discussed with [name] on 11/22/2013 at 6:26 pm.   Electronically Signed   By: Rosalie Gums M.D.   On: 11/22/2013 18:26   Dr. Penne Lash at [facility] discussing results and need for D&E via [place] interpreter.   Assessment: 1. Elevated level of quantitative hCG for gestational age in  early pregnancy   Concern for molar pregnancy vs missed AB.  Plan: Discharge pt in stable condition. D&E scheduled for tomorrow NPO after MN. Support given Needsand pathology w/ D&E and F/U quants weekly until <1. Follow-up Information   Follow up with [facility] OPERATING ROOM On 11/23/2013.   Contact information:   [address] [id number] [place] [address] [phone]      Follow up with [facility] MATERNITY ADMISSIONS. (As needed in emergencies)    Contact information:   [address] [id number] [place] [address] [phone]       Medication List         acetaminophen 500 MG tablet  Commonly known as:  TYLENOL  Take 1,000 mg by mouth daily as needed for moderate pain.     acetaminophen-codeine 300-30 MG per tablet  Commonly known as:  TYLENOL #3  Take 1-2 tablets by mouth every 6 (six) hours as needed for moderate pain.     prenatal multivitamin Tabs tablet  Take 1 tablet by mouth daily.       [name] 11/22/2013, 4:51 PM

## 2013-11-22 NOTE — MAU Note (Signed)
Repeat labs.   No other problems.

## 2013-11-22 NOTE — Discharge Instructions (Signed)
Your ultrasound shows an abnormal pregnancy where a baby did not develop. It may be a molar pregnancy which can be dangerous to you if not treated thoroughly. You are scheduled for a dilation and evacuation tomorrow, 8/10/215 at 2:00 pm [facility]. [facility] at 12:30 pm  Please do not eat or drink anything at midnight tonight.     Molar Pregnancy A molar pregnancy (hydatidiform mole) is a mass of tissue that grows in the uterus after conception. The mass is created by an egg that was not fertilized correctly and abnormally grows. It is an abnormal pregnancy and does not develop into a fetus. If a molar pregnancy is suspected by your health care provider, treatment is required. CAUSES  Molar pregnancy is caused by an egg that is fertilized incorrectly so that it has abnormal genetic material (chromosomes). This can result in one of 2 types of molar pregnancy:  Complete molar pregnancy--All of the chromosomes in the fertilized egg come from the father; none come from the mother.  Partial molar pregnancy--The fertilized egg has chromosomes from the father and mother, but it has too many chromosomes. RISK FACTORS  Certain risk factors make a molar pregnancy more likely. They include:   Being over age 37 or under age 37.  History of a molar pregnancy in the past (extremely small chance of recurrence). Other possible risk factors include:   Smoking more than 15 cigarettes per day.  History of infertility.  Having a certain blood type (A, B, AB).  Having a vitamin A deficiency.  Using oral contraceptives. SIGNS AND SYMPTOMS   Vaginal bleeding.  Missed menstrual period.  Uterus grows quicker than normal.  Severe nausea and vomiting.  Severe pressure or pain in the uterus.  Abnormal ovarian cysts (theca lutein cysts).  Discharge from the vagina that looks like grapes.  High blood pressure (early onset of  preeclampsia).  Overactive thyroid (hyperthyroidism).  Anemia. DIAGNOSIS  If your health care provider thinks there is a chance of a molar pregnancy, testing will be recommended. Possible tests include:   An ultrasound test.  Blood tests. TREATMENT  Most molar pregnancies end on their own by miscarriage. However, a health care provider needs to make sure that all the abnormal tissue is out of the womb. This can be done with dilation and curettage (D&C) or suction curettage. In this procedure, any remaining molar tissue is removed through the vagina. After diagnosis of a molar pregnancy, the pregnancy hormone levels must be followed until the level is zero. If the pregnancy hormone level does not drop appropriately, chemotherapy may be necessary. Also, you will be given a medicine called Rho (D) immune globulin if you are Rh negative and your sex partner is Rh positive. This helps prevent Rh problems in future pregnancies. HOME CARE INSTRUCTIONS   Avoid getting pregnant for 6-12 months or as directed by your health care provider. Use a reliable form of birth control or do not have sex.  Only take over-the-counter or prescription medicine as directed by your health care provider.  Keep all follow-up appointments and get all suggested lab tests and ultrasound tests.  Gradually return to normal activities.  Think about joining a support group. Ask for help if you are struggling with grief. Document Released: 12/19/2010 Document Revised: 08/17/2013 Document Reviewed: 10/30/2012 [facility] Patient Information 2015 [place], [place]. This information is not intended to replace advice given to you by your health care provider. Make sure you discuss  any questions you have with your health care provider. ° °

## 2013-11-23 ENCOUNTER — Ambulatory Visit (HOSPITAL_COMMUNITY): Payer: Medicaid Other | Admitting: Anesthesiology

## 2013-11-23 ENCOUNTER — Ambulatory Visit (HOSPITAL_COMMUNITY)
Admission: AD | Admit: 2013-11-23 | Discharge: 2013-11-23 | Disposition: A | Discharge status: Home / Self Care | Payer: Medicaid Other | Source: Ambulatory Visit | Attending: Obstetrics & Gynecology | Admitting: Obstetrics & Gynecology

## 2013-11-23 ENCOUNTER — Encounter (HOSPITAL_COMMUNITY): Payer: Medicaid Other | Admitting: Anesthesiology

## 2013-11-23 ENCOUNTER — Encounter (HOSPITAL_COMMUNITY)
Admission: AD | Disposition: A | Discharge status: Home / Self Care | Payer: Self-pay | Source: Ambulatory Visit | Attending: Obstetrics & Gynecology

## 2013-11-23 ENCOUNTER — Encounter (HOSPITAL_COMMUNITY): Payer: Self-pay

## 2013-11-23 DIAGNOSIS — O021 Missed abortion: Secondary | ICD-10-CM | POA: Diagnosis not present

## 2013-11-23 DIAGNOSIS — O0289 Other abnormal products of conception: Secondary | ICD-10-CM | POA: Diagnosis not present

## 2013-11-23 DIAGNOSIS — O020 Blighted ovum and nonhydatidiform mole: Secondary | ICD-10-CM

## 2013-11-23 HISTORY — PX: DILATION AND EVACUATION: SHX1459

## 2013-11-23 LAB — TYPE AND SCREEN
ABO/RH(D): O POS
ANTIBODY SCREEN: NEGATIVE

## 2013-11-23 LAB — CBC
HCT: 36.6 % (ref 36.0–46.0)
Hemoglobin: 12.6 g/dL (ref 12.0–15.0)
MCH: 31.9 pg (ref 26.0–34.0)
MCHC: 34.4 g/dL (ref 30.0–36.0)
MCV: 92.7 fL (ref 78.0–100.0)
PLATELETS: 193 10*3/uL (ref 150–400)
RBC: 3.95 MIL/uL (ref 3.87–5.11)
RDW: 12.2 % (ref 11.5–15.5)
WBC: 6 10*3/uL (ref 4.0–10.5)

## 2013-11-23 SURGERY — DILATION AND EVACUATION, UTERUS
Anesthesia: Monitor Anesthesia Care | Site: Uterus

## 2013-11-23 MED ORDER — ONDANSETRON HCL 4 MG/2ML IJ SOLN
INTRAMUSCULAR | Status: AC
  Filled 2013-11-23: qty 2

## 2013-11-23 MED ORDER — DEXAMETHASONE SODIUM PHOSPHATE 10 MG/ML IJ SOLN
INTRAMUSCULAR | Status: AC
  Filled 2013-11-23: qty 1

## 2013-11-23 MED ORDER — LIDOCAINE HCL (CARDIAC) 20 MG/ML IV SOLN
INTRAVENOUS | Status: AC
  Filled 2013-11-23: qty 5

## 2013-11-23 MED ORDER — LACTATED RINGERS IV SOLN
INTRAVENOUS | Status: DC
  Administered 2013-11-23 (×2): via INTRAVENOUS

## 2013-11-23 MED ORDER — MEPERIDINE HCL 25 MG/ML IJ SOLN: 6.2500 mg | INTRAMUSCULAR | Status: DC | PRN

## 2013-11-23 MED ORDER — FENTANYL CITRATE 0.05 MG/ML IJ SOLN
INTRAMUSCULAR | Status: AC
  Filled 2013-11-23: qty 2

## 2013-11-23 MED ORDER — FENTANYL CITRATE 0.05 MG/ML IJ SOLN
INTRAMUSCULAR | Status: DC | PRN
  Administered 2013-11-23 (×2): 50 ug via INTRAVENOUS

## 2013-11-23 MED ORDER — OXYTOCIN 10 UNIT/ML IJ SOLN
INTRAMUSCULAR | Status: AC
  Filled 2013-11-23: qty 1

## 2013-11-23 MED ORDER — METOCLOPRAMIDE HCL 5 MG/ML IJ SOLN: 10.0000 mg | Freq: Once | INTRAMUSCULAR | Status: DC | PRN

## 2013-11-23 MED ORDER — BUPIVACAINE-EPINEPHRINE (PF) 0.5% -1:200000 IJ SOLN
INTRAMUSCULAR | Status: AC
  Filled 2013-11-23: qty 30

## 2013-11-23 MED ORDER — BUPIVACAINE-EPINEPHRINE 0.5% -1:200000 IJ SOLN
INTRAMUSCULAR | Status: DC | PRN
  Administered 2013-11-23: 10 mL

## 2013-11-23 MED ORDER — MIDAZOLAM HCL 2 MG/2ML IJ SOLN
INTRAMUSCULAR | Status: AC
  Filled 2013-11-23: qty 2

## 2013-11-23 MED ORDER — SCOPOLAMINE 1 MG/3DAYS TD PT72
1.0000 | MEDICATED_PATCH | Freq: Once | TRANSDERMAL | Status: DC
  Administered 2013-11-23: 1.5 mg via TRANSDERMAL

## 2013-11-23 MED ORDER — DEXAMETHASONE SODIUM PHOSPHATE 10 MG/ML IJ SOLN
INTRAMUSCULAR | Status: DC | PRN
  Administered 2013-11-23: 4 mg via INTRAVENOUS

## 2013-11-23 MED ORDER — FENTANYL CITRATE 0.05 MG/ML IJ SOLN: 25.0000 ug | INTRAMUSCULAR | Status: DC | PRN

## 2013-11-23 MED ORDER — LACTATED RINGERS IV SOLN
INTRAVENOUS | Status: DC
  Administered 2013-11-23: 13:00:00 via INTRAVENOUS

## 2013-11-23 MED ORDER — ONDANSETRON HCL 4 MG/2ML IJ SOLN
INTRAMUSCULAR | Status: DC | PRN
  Administered 2013-11-23: 4 mg via INTRAVENOUS

## 2013-11-23 MED ORDER — KETOROLAC TROMETHAMINE 30 MG/ML IJ SOLN
INTRAMUSCULAR | Status: AC
  Filled 2013-11-23: qty 1

## 2013-11-23 MED ORDER — DOXYCYCLINE HYCLATE 100 MG IV SOLR
200.0000 mg | INTRAVENOUS | Status: AC
  Administered 2013-11-23: 200 mg via INTRAVENOUS
  Filled 2013-11-23: qty 200

## 2013-11-23 MED ORDER — PROPOFOL 10 MG/ML IV BOLUS
INTRAVENOUS | Status: DC | PRN
  Administered 2013-11-23: 150 mg via INTRAVENOUS

## 2013-11-23 MED ORDER — LIDOCAINE HCL (CARDIAC) 20 MG/ML IV SOLN
INTRAVENOUS | Status: DC | PRN
  Administered 2013-11-23: 40 mg via INTRAVENOUS

## 2013-11-23 MED ORDER — KETOROLAC TROMETHAMINE 30 MG/ML IJ SOLN
INTRAMUSCULAR | Status: DC | PRN
  Administered 2013-11-23: 30 mg via INTRAVENOUS

## 2013-11-23 MED ORDER — MIDAZOLAM HCL 5 MG/5ML IJ SOLN
INTRAMUSCULAR | Status: DC | PRN
  Administered 2013-11-23: 2 mg via INTRAVENOUS

## 2013-11-23 MED ORDER — SCOPOLAMINE 1 MG/3DAYS TD PT72
MEDICATED_PATCH | TRANSDERMAL | Status: AC
  Administered 2013-11-23: 1.5 mg via TRANSDERMAL
  Filled 2013-11-23: qty 1

## 2013-11-23 MED ORDER — PROPOFOL 10 MG/ML IV EMUL
INTRAVENOUS | Status: AC
  Filled 2013-11-23: qty 20

## 2013-11-23 MED ORDER — LACTATED RINGERS IV SOLN
40.0000 [IU] | INTRAVENOUS | Status: DC | PRN
  Administered 2013-11-23: 10 [IU] via INTRAVENOUS

## 2013-11-23 SURGICAL SUPPLY — 18 items
CATH ROBINSON RED A/P 16FR (CATHETERS) ×3 IMPLANT
CLOTH BEACON ORANGE TIMEOUT ST (SAFETY) ×3 IMPLANT
DECANTER SPIKE VIAL GLASS SM (MISCELLANEOUS) ×3 IMPLANT
GLOVE BIO SURGEON STRL SZ 6.5 (GLOVE) ×2 IMPLANT
GLOVE BIO SURGEONS STRL SZ 6.5 (GLOVE) ×1
GLOVE BIOGEL PI IND STRL 7.0 (GLOVE) ×1 IMPLANT
GLOVE BIOGEL PI INDICATOR 7.0 (GLOVE) ×2
GLOVE INDICATOR 7.0 STRL GRN (GLOVE) ×9 IMPLANT
GLOVE SURG SS PI 7.0 STRL IVOR (GLOVE) ×12 IMPLANT
GOWN STRL REUS W/TWL LRG LVL3 (GOWN DISPOSABLE) ×6 IMPLANT
KIT BERKELEY 1ST TRIMESTER 3/8 (MISCELLANEOUS) ×3 IMPLANT
NS IRRIG 1000ML POUR BTL (IV SOLUTION) ×3 IMPLANT
PACK VAGINAL MINOR WOMEN LF (CUSTOM PROCEDURE TRAY) ×3 IMPLANT
PAD OB MATERNITY 4.3X12.25 (PERSONAL CARE ITEMS) ×3 IMPLANT
PAD PREP 24X48 CUFFED NSTRL (MISCELLANEOUS) ×3 IMPLANT
SET BERKELEY SUCTION TUBING (SUCTIONS) ×3 IMPLANT
TOWEL OR 17X24 6PK STRL BLUE (TOWEL DISPOSABLE) ×6 IMPLANT
VACURETTE 9 RIGID CVD (CANNULA) ×3 IMPLANT

## 2013-11-23 NOTE — Transfer of Care (Signed)
Immediate Anesthesia Transfer of Care Note  Patient: Patty Cuevas  Procedure(s) Performed: Procedure(s): DILATATION AND EVACUATION (N/A)  Patient Location: PACU  Anesthesia Type:General  Level of Consciousness: sedated  Airway & Oxygen Therapy: Patient Spontanous Breathing and Patient connected to nasal cannula oxygen  Post-op Assessment: Report given to PACU RN and Post -op Vital signs reviewed and stable  Post vital signs: stable  Complications: No apparent anesthesia complications

## 2013-11-23 NOTE — Discharge Instructions (Signed)
DISCHARGE INSTRUCTIONS: D&E The following instructions have been prepared to help you care for yourself upon your return home.  MAY TAKE IBUPROFEN (MOTRIN, ADVIL) OR ALEVE AFTER 8:30 PM FOR CRAMPS!!!!   Personal hygiene:  Use sanitary pads for vaginal drainage, not tampons.  Shower the day after your procedure.  NO tub baths, pools or Jacuzzis for 2-3 weeks.  Wipe front to back after using the bathroom.  Activity and limitations:  Do NOT drive or operate any equipment for 24 hours. The effects of anesthesia are still present and drowsiness may result.  Do NOT rest in bed all day.  Walking is encouraged.  Walk up and down stairs slowly.  You may resume your normal activity in one to two days or as indicated by your physician.  Sexual activity: NO intercourse for at least 2 weeks after the procedure, or as indicated by your physician.  Diet: Eat a light meal as desired this evening. You may resume your usual diet tomorrow.  Return to work: You may resume your work activities in one to two days or as indicated by your doctor.  What to expect after your surgery: Expect to have vaginal bleeding/discharge for 2-3 days and spotting for up to 10 days. It is not unusual to have soreness for up to 1-2 weeks. You may have a slight burning sensation when you urinate for the first day. Mild cramps may continue for a couple of days. You may have a regular period in 2-6 weeks.  Call your doctor for any of the following:  Excessive vaginal bleeding, saturating and changing one pad every hour.  Inability to urinate 6 hours after discharge from hospital.  Pain not relieved by pain medication.  Fever of 100.4 F or greater.  Unusual vaginal discharge or odor.   Call for an appointment:    Patients signature: ______________________  Nurses signature ________________________  Support person's signature_______________________    Leodis Binet Pregnancy A molar pregnancy (hydatidiform  mole) is a mass of tissue that grows in the uterus after conception. The mass is created by an egg that was not fertilized correctly and abnormally grows. It is an abnormal pregnancy and does not develop into a fetus. If a molar pregnancy is suspected by your health care provider, treatment is required. CAUSES  Molar pregnancy is caused by an egg that is fertilized incorrectly so that it has abnormal genetic material (chromosomes). This can result in one of 2 types of molar pregnancy:  Complete molar pregnancy--All of the chromosomes in the fertilized egg come from the father; none come from the mother.  Partial molar pregnancy--The fertilized egg has chromosomes from the father and mother, but it has too many chromosomes. RISK FACTORS  Certain risk factors make a molar pregnancy more likely. They include:   Being over age 58 or under age 1.  History of a molar pregnancy in the past (extremely small chance of recurrence). Other possible risk factors include:   Smoking more than 15 cigarettes per day.  History of infertility.  Having a certain blood type (A, B, AB).  Having a vitamin A deficiency.  Using oral contraceptives. SIGNS AND SYMPTOMS   Vaginal bleeding.  Missed menstrual period.  Uterus grows quicker than normal.  Severe nausea and vomiting.  Severe pressure or pain in the uterus.  Abnormal ovarian cysts (theca lutein cysts).  Discharge from the vagina that looks like grapes.  High blood pressure (early onset of preeclampsia).  Overactive thyroid (hyperthyroidism).  Anemia. DIAGNOSIS  If  your health care provider thinks there is a chance of a molar pregnancy, testing will be recommended. Possible tests include:   An ultrasound test.  Blood tests. TREATMENT  Most molar pregnancies end on their own by miscarriage. However, a health care provider needs to make sure that all the abnormal tissue is out of the womb. This can be done with dilation and curettage  (D&C) or suction curettage. In this procedure, any remaining molar tissue is removed through the vagina. After diagnosis of a molar pregnancy, the pregnancy hormone levels must be followed until the level is zero. If the pregnancy hormone level does not drop appropriately, chemotherapy may be necessary. Also, you will be given a medicine called Rho (D) immune globulin if you are Rh negative and your sex partner is Rh positive. This helps prevent Rh problems in future pregnancies. HOME CARE INSTRUCTIONS   Avoid getting pregnant for 6-12 months or as directed by your health care provider. Use a reliable form of birth control or do not have sex.  Only take over-the-counter or prescription medicine as directed by your health care provider.  Keep all follow-up appointments and get all suggested lab tests and ultrasound tests.  Gradually return to normal activities.  Think about joining a support group. Ask for help if you are struggling with grief. Document Released: 12/19/2010 Document Revised: 08/17/2013 Document Reviewed: 10/30/2012 [employer] [facility]ExitCare Patient Information 2015 [place], [place]. This information is not intended to replace advice given to you by your health care provider. Make sure you discuss any questions you have with your health care provider.

## 2013-11-23 NOTE — Anesthesia Preprocedure Evaluation (Addendum)
Anesthesia Evaluation  Patient identified by MRN, date of birth, ID band Patient awake    Reviewed: Allergy & Precautions, H&P , NPO status , Patient's Chart, lab work & pertinent test results  Airway Mallampati: II TM Distance: >3 FB Neck ROM: Full    Dental no notable dental hx. (+) Teeth Intact   Pulmonary neg pulmonary ROS,  breath sounds clear to auscultation  Pulmonary exam normal       Cardiovascular negative cardio ROS  Rhythm:Regular Rate:Normal     Neuro/Psych negative neurological ROS  negative psych ROS   GI/Hepatic negative GI ROS, Neg liver ROS,   Endo/Other  negative endocrine ROS  Renal/GU negative Renal ROS  negative genitourinary   Musculoskeletal negative musculoskeletal ROS (+)   Abdominal   Peds  Hematology negative hematology ROS (+)   Anesthesia Other Findings   Reproductive/Obstetrics (+) Pregnancy Molar pregnancy                           Anesthesia Physical Anesthesia Plan  ASA: II  Anesthesia Plan: MAC   Post-op Pain Management:    Induction: Intravenous  Airway Management Planned:   Additional Equipment:   Intra-op Plan:   Post-operative Plan:   Informed Consent: I have reviewed the patients History and Physical, chart, labs and discussed the procedure including the risks, benefits and alternatives for the proposed anesthesia with the patient or authorized representative who has indicated his/her understanding and acceptance.     Plan Discussed with: Anesthesiologist, CRNA and Surgeon  Anesthesia Plan Comments:        Anesthesia Quick Evaluation

## 2013-11-23 NOTE — Op Note (Signed)
Procedure: Suction dilation and curettage Preoperative diagnosis: Intrauterine pregnancy [redacted] weeks gestation with suspected molar pregnancy Postoperative diagnosis: Same Surgeon: Dr. Scheryl DarterJames Arnold Anesthesia: MAC Specimen: Products of conception Complications: None Estimated blood loss: Negligible Counts: Correct Drains: None  Patient gave written consent for suction dilation and curettage after diagnosis of possible molar pregnancy was made by ultrasound at [redacted] weeks gestation. Patient identification was confirmed was brought to the operating room and MAC anesthesia was induced. His placed in dorsal lithotomy position. Perineum and vagina were sterilely prepped and draped. The bladder was drained with a catheter. Examination revealed about six-week size uterus. No adnexal masses. Speculum was inserted and cervix was visualized. Half percent Marcaine with 1 200,000 epinephrine was infiltrated for intracervical block. Cervix was grasped with single-tooth tenaculum. Uterus sounded to 10 cm. Cervix was dilated sufficiently to pass a 9 mm suction curette. Suction curettage was performed and products of conception were obtained. Patient received IV pitocin. Complete evacuation of the uterine cavity was assured. There was minimal bleeding at the end of procedure. Tolerated procedure well without comp patient was brought in stable condition to PACU.  Scheryl DarterJames Arnold 11/23/2013

## 2013-11-23 NOTE — H&P (Signed)
Patty Cuevas is an 38 y.o. female. [id number] Patient's last menstrual period was 10/05/2013. [redacted]w[redacted]d Abnormal [place] and elevated HCG c/w possible molar pregnancy scheduled for suction dilation and curettage. The procedure and indication were explained and the risks discussed and her questions were answered.   Menstrual History:  Patient's last menstrual period was 10/05/2013.    Past Medical History  Diagnosis Date  . Medical history non-contributory     Past Surgical History  Procedure Laterality Date  . No past surgeries      No family history on file.  Social History:  reports that she has never smoked. She does not have any smokeless tobacco history on file. She reports that she does not drink alcohol or use illicit drugs.  Allergies: No Known Allergies  Prescriptions prior to admission  Medication Sig Dispense Refill  . Prenatal Vit-Fe Fumarate-FA (PRENATAL MULTIVITAMIN) TABS tablet Take 1 tablet by mouth daily.      Marland Kitchen acetaminophen-codeine (TYLENOL #3) 300-30 MG per tablet Take 1-2 tablets by mouth every 6 (six) hours as needed for moderate pain.  15 tablet  0    Review of Systems  Constitutional: Negative.   Respiratory: Negative.   Cardiovascular: Negative.   Genitourinary: Negative.     Blood pressure 115/72, pulse 66, temperature 98.6 F (37 C), temperature source Oral, resp. rate 16, last menstrual period 10/05/2013, SpO2 100.00%. Physical Exam  Constitutional: She appears well-developed. No distress.  Respiratory: Effort normal. No respiratory distress.  GI: Soft. There is no tenderness.  Musculoskeletal: Normal range of motion.  Skin: Skin is warm.  Psychiatric: She has a normal mood and affect. Her behavior is normal.    Results for orders placed during the hospital encounter of 11/23/13 (from the past 24 hour(s))  CBC     Status: None   Collection Time    11/23/13 12:40 PM      Result Value Ref Range   WBC 6.0  4.0 - 10.5 K/uL   RBC 3.95  3.87 -  5.11 MIL/uL   Hemoglobin 12.6  12.0 - 15.0 g/dL   HCT 52.8  41.3 - 24.4 %   MCV 92.7  78.0 - 100.0 fL   MCH 31.9  26.0 - 34.0 pg   MCHC 34.4  30.0 - 36.0 g/dL   RDW 01.0  27.2 - 53.6 %   Platelets 193  150 - 400 K/uL    US Ob Transvaginal  11/22/2013   CLINICAL DATA:  Question of molar pregnancy. Quantitative beta HCG is 132,172 today. On 11/20/2013 quantitative beta HCG was 102,327. By LMP the patient is 6 weeks 6 days. EDC by LMP is 07/12/2014.  EXAM: TRANSVAGINAL OB ULTRASOUND  TECHNIQUE: Transvaginal ultrasound was performed for complete evaluation of the gestation as well as the maternal uterus, adnexal regions, and pelvic cul-de-sac.  COMPARISON:  11/20/2013  FINDINGS: Intrauterine gestational sac: Present  Yolk sac:  Not definitely seen  Embryo: Solid and cystic structure within the gestational sac measures 3.5 x 1.7 x 2.8 cm. Normal embryonic anatomy is absent.  Cardiac Activity: None  MSD: 39  mm   9 w   4  d  Maternal uterus/adnexae: The ovaries have a normal appearance. No theca lutein cysts identified. No cystic degeneration of the placenta.  IMPRESSION: 1. Findings are inconsistent with a normal viable pregnancy. 2. There remains the question of molar pregnancy given the abnormal appearance of the structure within the gestational sac and the lack of normal embryonic anatomy. 3. Consider karyotype  if D and C is performed. Subsequent followup quantitative beta hCGs would be recommended if positive for molar pregnancy. The findings were discussed with Dorathy KinsmanVIRGINIA SMITH on 11/22/2013 at 6:26 pm.   Electronically Signed   By: Rosalie GumsBeth  Brown M.D.   On: 11/22/2013 18:26    Assessment/Plan: Procedure scheduled for today, consent signed.  ARNOLD,JAMES 11/23/2013, 1:37 PM

## 2013-11-23 NOTE — Anesthesia Postprocedure Evaluation (Signed)
Anesthesia Post Note  Patient: Patty Cuevas  Procedure(s) Performed: Procedure(s) (LRB): DILATATION AND EVACUATION (N/A)  Anesthesia type: General  Patient location: PACU  Post pain: Pain level controlled  Post assessment: Post-op Vital signs reviewed  Last Vitals:  Filed Vitals:   11/23/13 1515  BP: 110/71  Pulse: 61  Temp:   Resp: 20    Post vital signs: Reviewed  Level of consciousness: sedated  Complications: No apparent anesthesia complications

## 2013-11-23 NOTE — MAU Provider Note (Signed)
Pt seen and discussed results of US.  Given concern for molar gestation, pt needs D&E soon.  Scheduled for Monday August 10 with Dr. Debroah LoopArnold. All questions answered (used [place] interpreter.

## 2013-11-24 ENCOUNTER — Encounter (HOSPITAL_COMMUNITY): Payer: Self-pay | Admitting: Obstetrics & Gynecology

## 2014-01-11 NOTE — MAU Provider Note (Signed)
CSN: [id number]   Arrival date and time: 11/20/13 1514   Patty Cuevas,[id number], is a 38 year old African American female who is [redacted] weeks pregnants and presents to the maternal admissions unit complaining of a severe headache and abdominal pain. She states that the headache began in the middle of last week. She states that the pain is "all over her head". She states that the pain is constant throughout the day. Patient states she has been taking Tylenol but it hasn't been helping. She rates the pain an 8 on the severity scale. The patients husband is present and states that the patient does not drink much water during the   Patient endorses abdominal pain. She locates the pain to the lower left quadrant. She describes the pain as "wave-like and happens once in a while." She rates the pain a 2 or 3 on the severity scale. She hasn't done anything to make her abdomen feel better. Patient endorses white vaginal discharge. Denies vaginal bleeding and itching. States there were no complications in her previous 4 pregnancies. She endorses nausea and fever.        Chief Complaint   Patient presents with   .  Headache      Patient is a 38 y.o. female presenting with headaches. The history is provided by the patient.  Headache The primary symptoms include headaches, fever and nausea. Primary symptoms do not include syncope, dizziness, visual change, paresthesias, loss of sensation, speech change or memory loss. The symptoms began 5 to 7 days ago. The symptoms are unchanged. The neurological symptoms are diffuse. The symptoms occurred during pregnancy.  The headache began more than 2 days ago. The headache developed gradually. Headache is a new problem. The headache is present continuously. The pain from the headache is at a severity of 8/10. Location/region(s) of the headache: left unilateral, right unilateral, bilateral, frontal, temporal, parietal, occipital and right periorbital. The headache is  associated with nothing. The headache is not associated with aura, double vision, eye pain, visual change, paresthesias or weakness.  Additional symptoms do not include weakness or aura. Medical issues do not include cerebral vascular accident, cancer, drug use or hypertension.           Past Medical History   Diagnosis  Date   .  Medical history non-contributory           Past Surgical History   Procedure  Laterality  Date   .  No past surgeries            No family history on file.    History   Substance Use Topics   .  Smoking status:  Never Smoker    .  Smokeless tobacco:  Not on file   .  Alcohol Use:  No        Allergies: No Known Allergies    Prescriptions prior to admission   Medication  Sig  Dispense  Refill   .  Acetaminophen-Caff-Pyrilamine (MIDOL COMPLETE PO)  Take 1 tablet by mouth every 6 (six) hours as needed (pain).         Marland Kitchen  HYDROcodone-acetaminophen (NORCO/VICODIN) 5-325 MG per tablet  Take 1 tablet by mouth every 6 (six) hours as needed for pain.   20 tablet   0       Results for orders placed during the hospital encounter of 11/20/13 (from the past 48 hour(s))   URINALYSIS, ROUTINE W REFLEX MICROSCOPIC     Status: Abnormal  Collection Time      11/20/13  3:30 PM       Result  Value  Ref Range     Color, Urine  YELLOW   YELLOW     APPearance  CLEAR   CLEAR     Specific Gravity, Urine  1.025   1.005 - 1.030     pH  6.0   5.0 - 8.0     Glucose, UA  NEGATIVE   NEGATIVE mg/dL     Hgb urine dipstick  NEGATIVE   NEGATIVE     Bilirubin Urine  NEGATIVE   NEGATIVE     Ketones, ur  15 (*)  NEGATIVE mg/dL     Protein, ur  NEGATIVE   NEGATIVE mg/dL     Urobilinogen, UA  0.2   0.0 - 1.0 mg/dL     Nitrite  NEGATIVE   NEGATIVE     Leukocytes, UA  NEGATIVE   NEGATIVE     Comment:  MICROSCOPIC NOT DONE ON URINES WITH NEGATIVE PROTEIN, BLOOD, LEUKOCYTES, NITRITE, OR GLUCOSE <1000 mg/dL.   POCT PREGNANCY, URINE     Status: Abnormal     Collection  Time      11/20/13  3:40 PM       Result  Value  Ref Range     Preg Test, Ur  POSITIVE (*)  NEGATIVE     Comment:                THE SENSITIVITY OF THIS        METHODOLOGY IS >24 mIU/mL   CBC     Status: Abnormal     Collection Time      11/20/13  5:45 PM       Result  Value  Ref Range     WBC  7.4   4.0 - 10.5 K/uL     RBC  3.87   3.87 - 5.11 MIL/uL     Hemoglobin  12.4   12.0 - 15.0 g/dL     HCT  40.9 (*)  81.1 - 46.0 %     MCV  92.5   78.0 - 100.0 fL     MCH  32.0   26.0 - 34.0 pg     MCHC  34.6   30.0 - 36.0 g/dL     RDW  91.4   78.2 - 15.5 %     Platelets  188   150 - 400 K/uL   HCG, QUANTITATIVE, PREGNANCY     Status: Abnormal     Collection Time      11/20/13  5:45 PM       Result  Value  Ref Range     hCG, Beta Chain, Quant, S  [id number] (*)  <5 mIU/mL     Comment:                  GEST. AGE      CONC.  (mIU/mL)          <=1 WEEK        5 - 50            2 WEEKS       50 - 500            3 WEEKS       100 - 10,000            4 WEEKS     1,000 - 30,000  5 WEEKS     3,500 - 115,000          6-8 WEEKS     12,000 - 270,000           12 WEEKS     15,000 - 220,000                      FEMALE AND NON-PREGNANT FEMALE:            LESS THAN 5 mIU/mL   WET PREP, GENITAL     Status: Abnormal     Collection Time      11/20/13  7:00 PM       Result  Value  Ref Range     Yeast Wet Prep HPF POC  NONE SEEN   NONE SEEN     Trich, Wet Prep  FEW (*)  NONE SEEN     Clue Cells Wet Prep HPF POC  FEW (*)  NONE SEEN     WBC, Wet Prep HPF POC  FEW (*)  NONE SEEN     Comment:  BACTERIA- TOO NUMEROUS TO COUNT      US Ob Comp Less 14 Wks   11/20/2013   CLINICAL DATA:  Headache, pelvic pain, pregnant.  EXAM: OBSTETRIC <14 WK [place] AND TRANSVAGINAL OB US  TECHNIQUE: Both transabdominal and transvaginal ultrasound examinations were performed for complete evaluation of the gestation as well as the maternal uterus, adnexal regions, and pelvic cul-de-sac. Transvaginal technique was performed  to assess early pregnancy.  COMPARISON:  None.  FINDINGS: Intrauterine gestational sac: Visualized/normal in shape.  Yolk sac:  Not identified  Embryo: Not definitively identified. There is a mixed echogenicity rounded area within the gestational sacs without appreciable fetal heart tones, measuring 2.3 x 1.2 x 2.6 cm.  Cardiac Activity: Not identified  MSD:  33  mm   8 w   5  d  [place] EDC: 06/27/2014  Maternal uterus/adnexae: Normal sonographic appearance to the ovaries. No free fluid.  IMPRESSION: Intrauterine gestational sac contains a complex 2.3 cm rounded area which may reflect a failed pregnancy with retained products versus molar pregnancy. Recommend beta HCG correlation.   Electronically Signed   By: Jearld Lesch M.D.   On: 11/20/2013 18:31    US Ob Transvaginal   11/20/2013   CLINICAL DATA:  Headache, pelvic pain, pregnant.  EXAM: OBSTETRIC <14 WK [place] AND TRANSVAGINAL OB US  TECHNIQUE: Both transabdominal and transvaginal ultrasound examinations were performed for complete evaluation of the gestation as well as the maternal uterus, adnexal regions, and pelvic cul-de-sac. Transvaginal technique was performed to assess early pregnancy.  COMPARISON:  None.  FINDINGS: Intrauterine gestational sac: Visualized/normal in shape.  Yolk sac:  Not identified  Embryo: Not definitively identified. There is a mixed echogenicity rounded area within the gestational sacs without appreciable fetal heart tones, measuring 2.3 x 1.2 x 2.6 cm.  Cardiac Activity: Not identified  MSD:  33  mm   8 w   5  d  [place] EDC: 06/27/2014  Maternal uterus/adnexae: Normal sonographic appearance to the ovaries. No free fluid.  IMPRESSION: Intrauterine gestational sac contains a complex 2.3 cm rounded area which may reflect a failed pregnancy with retained products versus molar pregnancy. Recommend beta HCG correlation.   Electronically Signed   By: Jearld Lesch M.D.   On: 11/20/2013 18:31         Review of Systems   Constitutional: Positive for fever and chills. Negative for  weight loss, malaise/fatigue and diaphoresis.  Eyes: Negative for blurred vision, double vision and pain.  Respiratory: Negative for cough, hemoptysis, sputum production, shortness of breath and wheezing.   Cardiovascular: Negative for chest pain, palpitations and syncope.  Gastrointestinal: Positive for nausea and abdominal pain.  Neurological: Positive for headaches. Negative for dizziness, tingling, speech change, weakness and paresthesias.  Psychiatric/Behavioral: Negative for memory loss.     Physical Exam      Blood pressure 114/65, pulse 66, temperature 98.9 F (37.2 C), temperature source Oral, resp. rate 18, height 5' 2.5" (1.588 m), weight 147 lb 3.2 oz (66.769 kg), last menstrual period 10/05/2013.   Physical Exam  Constitutional: She appears well-developed and well-nourished.  HENT:   Head: Normocephalic and atraumatic.  Eyes: Conjunctivae and EOM are normal. Pupils are equal, round, and reactive to light. Right eye exhibits no discharge. Left eye exhibits no discharge. No scleral icterus.  Cardiovascular: Normal rate, regular rhythm, normal heart sounds and intact distal pulses.  Exam reveals no gallop and no friction rub.    No murmur heard. Respiratory: Effort normal and breath sounds normal. No respiratory distress. She has no wheezes. She has no rales. She exhibits no tenderness.  GI: Soft. There is no hepatomegaly. There is tenderness in the right lower quadrant and left lower quadrant. There is no rigidity and no guarding.    Speculum exam: Vagina - Small amount of creamy discharge, no odor Cervix - + contact bleeding; scant   Bimanual exam: Cervix closed, no CMT   Uterus non tender, normal size Adnexa non tender, no masses bilaterally GC/Chlam, wet prep done Chaperone present for exam.      MAU Course    Procedures None   MDM 1. Ruling out ectopic pregnancy 2.Headache; PO phenergan 25 mg  given and 650 mg tylenol given PO.   3. 1900 no relief from oral medication. HA is likely due to dehydration (ketones on urine sample). Pt would like to proceed with IV fluids and IV medication for the HA.   4. Wet prep shows trichomonas; Flagyl 2 grams given in MAU. Partner given RX   5. Patient rates her HA pain 1/10 at discharge home   6. US shows intrauterine gestational sac, no Yolk sac; questionable molar pregnancy.     Assessment and Plan      Assessment:    1.  Tension-type headache, not intractable, unspecified chronicity pattern    2.  Trichomonas infection    3.  Abdominal pain in pregnancy; cannot rule out ectopic pregnancy       Plan:   Discharge home in stable condition RX tylenol #3 Return to MAU in 48 hours for repeat beta hcg Ectopic precautions Return to MAU if abdominal pain and/or vaginal bleeding Partner RX given for the treatment of Trichomonas.     Shearon Balo M 11/20/2013, 4:25 PM    Evaluation and management procedures were performed by the PA student under my supervision and collaboration. I have reviewed the note and chart, and I agree with the management and plan.   Iona Hansen Raelan Burgoon, NP 11/20/2013 10:56 PM

## 2014-01-12 NOTE — MAU Provider Note (Signed)
Attestation of Attending Supervision of Advanced Practitioner (CNM/NP): Evaluation and management procedures were performed by the Advanced Practitioner under my supervision and collaboration. I have reviewed the Advanced Practitioner's note and chart, and I agree with the management and plan.  LEGGETT,KELLY H. 5:38 AM

## 2014-02-15 ENCOUNTER — Encounter (HOSPITAL_COMMUNITY): Payer: Self-pay | Admitting: Obstetrics & Gynecology

## 2014-09-25 ENCOUNTER — Encounter (HOSPITAL_COMMUNITY): Payer: Self-pay | Admitting: *Deleted

## 2015-02-25 IMAGING — US US OB TRANSVAGINAL
1 series · 14 of 28 positions shown · non-contrast
Comparison: None.

CLINICAL DATA: Headache, pelvic pain, pregnant.

EXAM:
OBSTETRIC <14 WK US AND TRANSVAGINAL OB US
TECHNIQUE: Both transabdominal and transvaginal ultrasound examinations were
performed for complete evaluation of the gestation as well as the
maternal uterus, adnexal regions, and pelvic cul-de-sac.
Transvaginal technique was performed to assess early pregnancy.

[Series 1: us ob comp less 14 wks · 79 acquisitions, 14 frames shown]
[im 3/79]
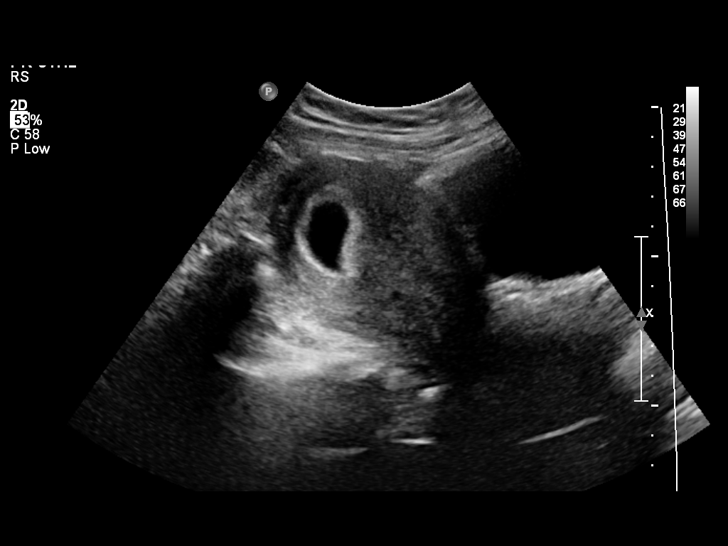
[im 9/79]
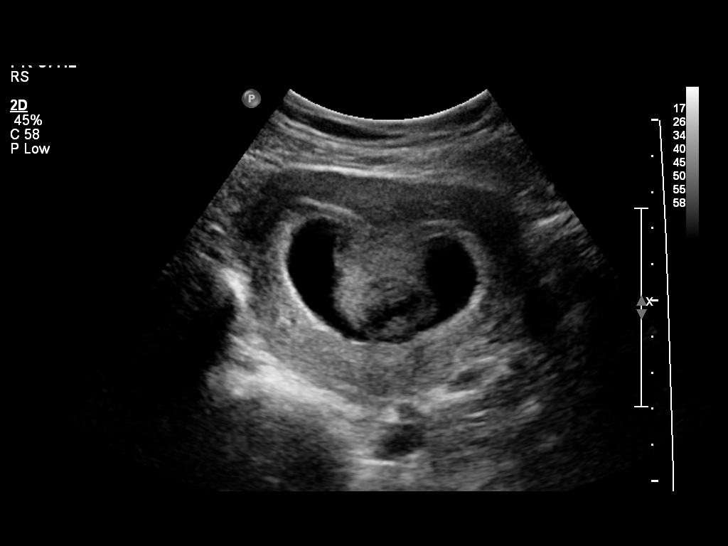
[im 15/79]
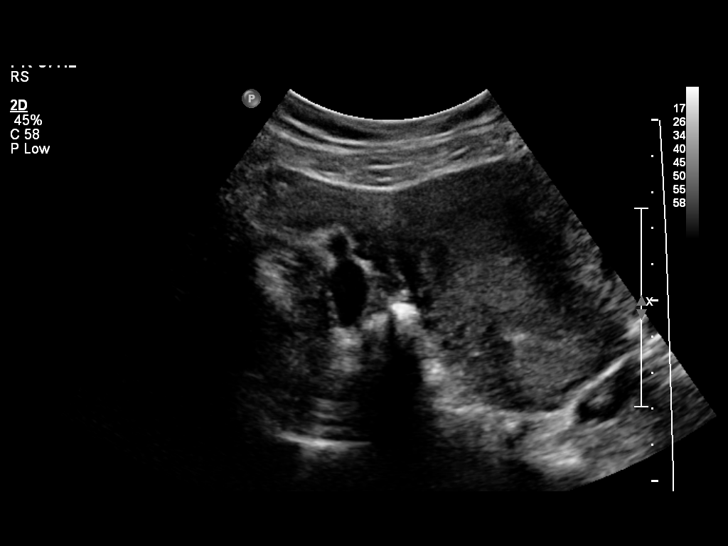
[im 21/79]
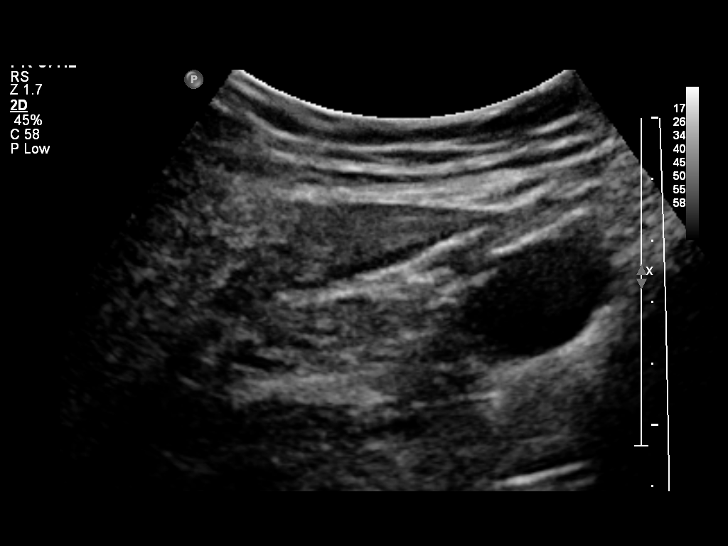
[im 27/79]
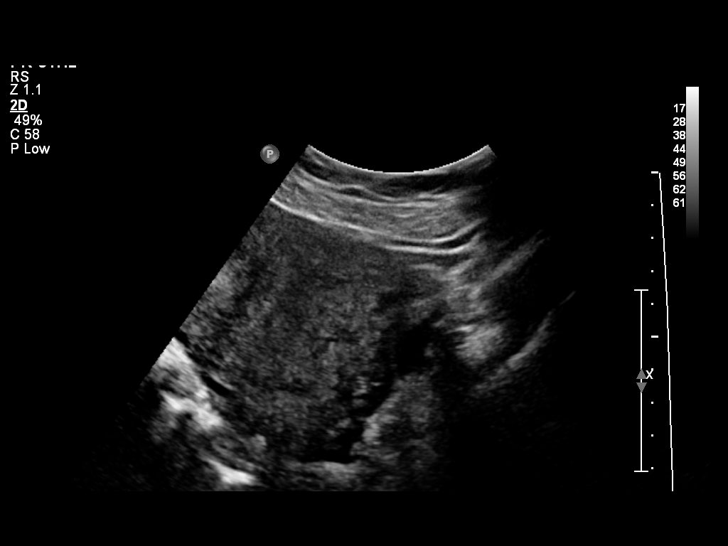
[im 32/79]
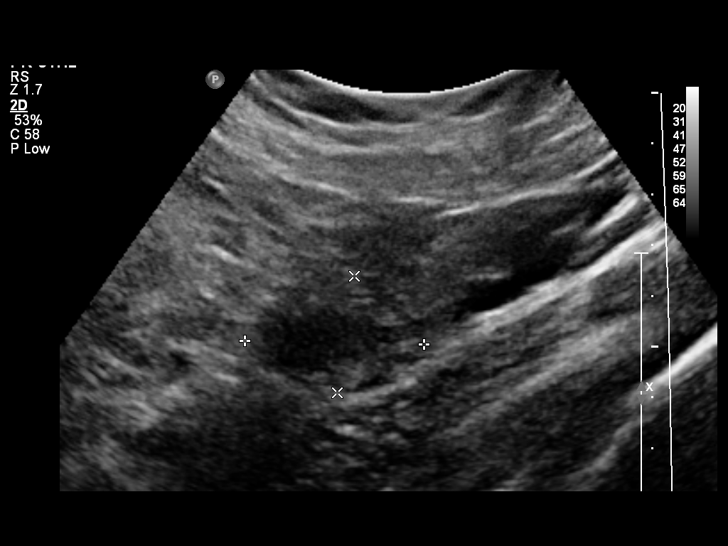
[im 38/79]
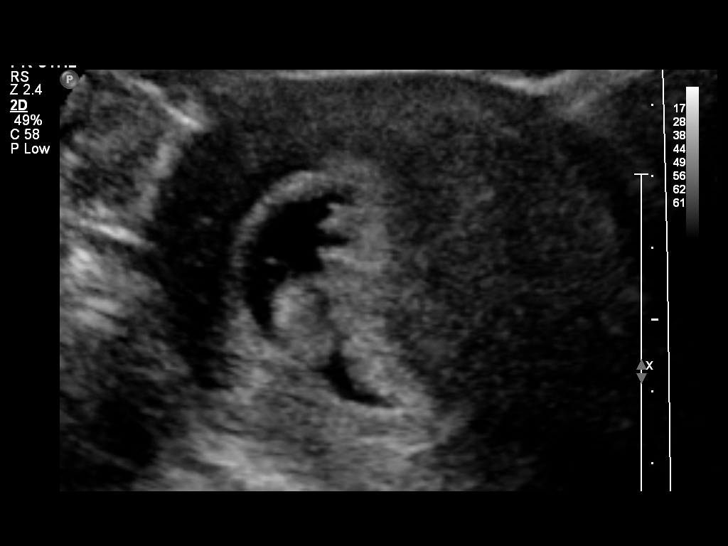
[im 44/79]
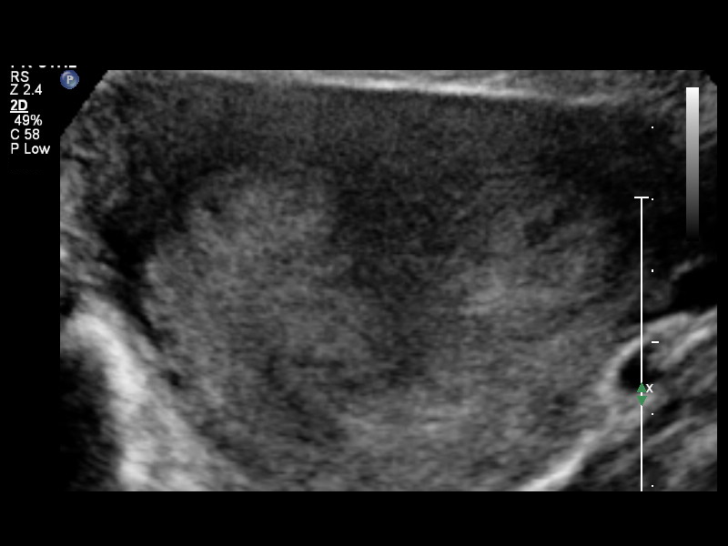
[im 50/79]
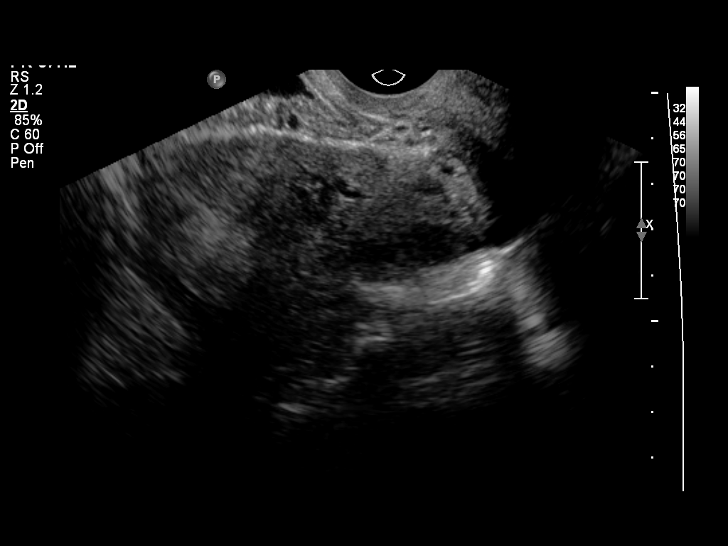
[im 55/79]
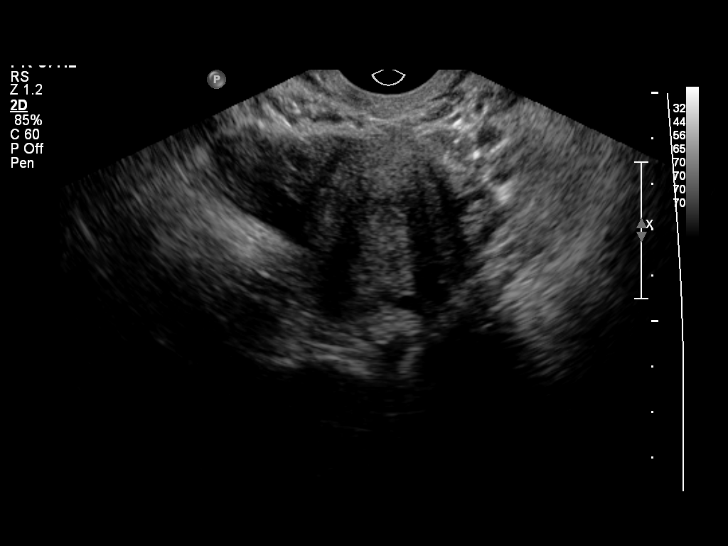
[im 61/79]
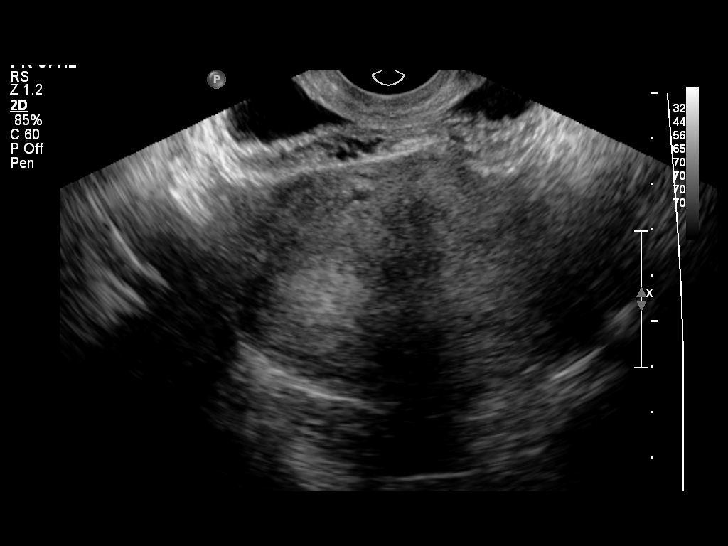
[im 67/79]
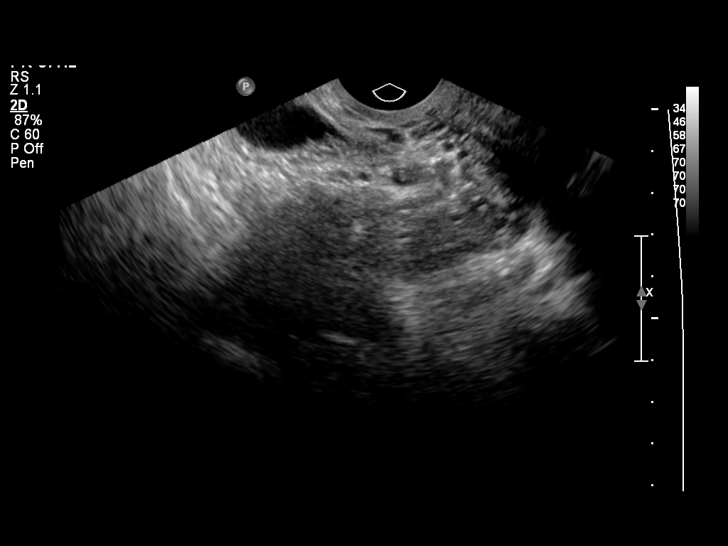
[im 73/79]
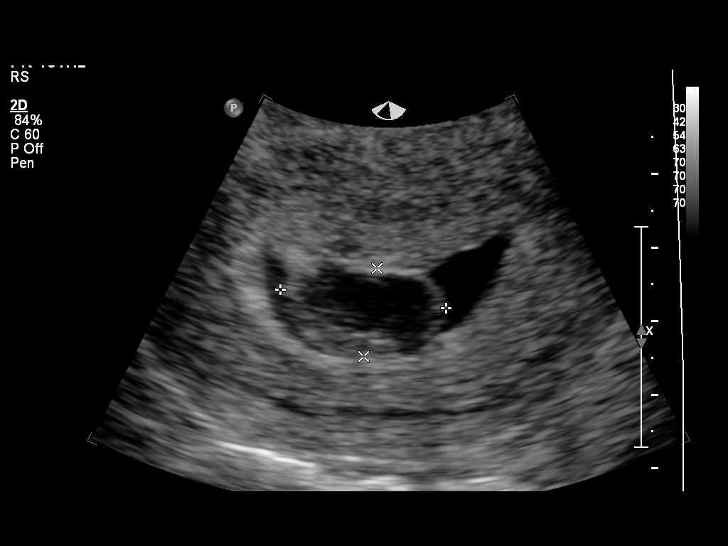
[im 79/79]
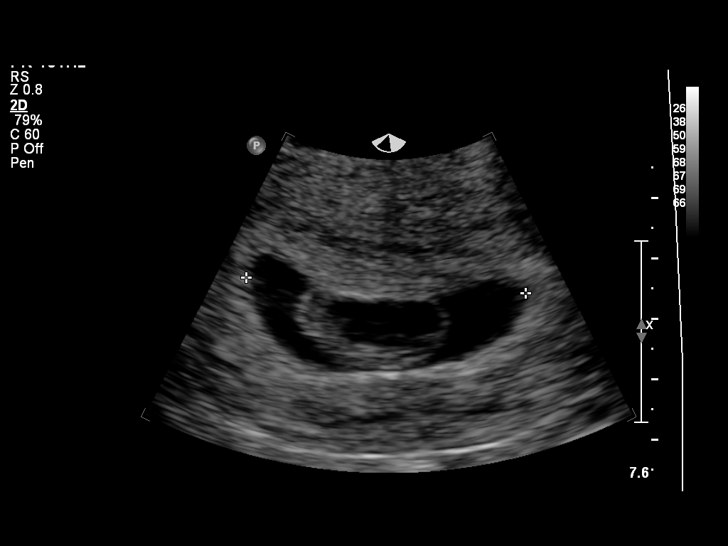

[14 of 28 positions shown; findings below may reference images not displayed]

FINDINGS: Intrauterine gestational sac: Visualized/normal in shape.

Yolk sac:  Not identified

Embryo: Not definitively identified. There is a mixed echogenicity
rounded area within the gestational sacs without appreciable fetal
heart tones, measuring 2.3 x 1.2 x 2.6 cm.

Cardiac Activity: Not identified

MSD:  33  mm   8 w   5  d

US EDC: 06/27/2014

Maternal uterus/adnexae: Normal sonographic appearance to the
ovaries. No free fluid.
IMPRESSION: Intrauterine gestational sac contains a complex 2.3 cm rounded area
which may reflect a failed pregnancy with retained products versus
molar pregnancy. Recommend beta HCG correlation.

## 2015-02-27 IMAGING — US US OB TRANSVAGINAL
1 series · 13 of 28 positions shown · non-contrast
Comparison: 11/20/2013

CLINICAL DATA: Question of molar pregnancy. Quantitative beta HCG
is 132,172 today. On 11/20/2013 quantitative beta HCG was [DATE].

EXAM:
TRANSVAGINAL OB ULTRASOUND
TECHNIQUE: Transvaginal ultrasound was performed for complete evaluation of the
gestation as well as the maternal uterus, adnexal regions, and
pelvic cul-de-sac.

[Series 1: us ob transvaginal · 13 of 36 slices shown]
[im 2/36]
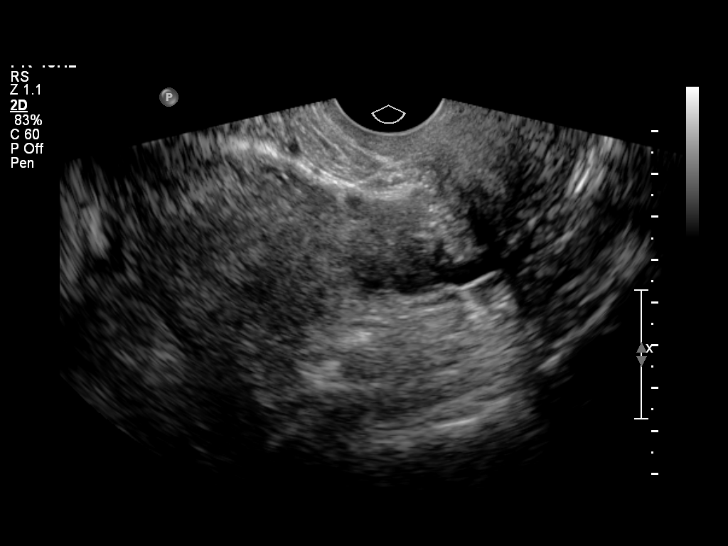
[im 4/36]
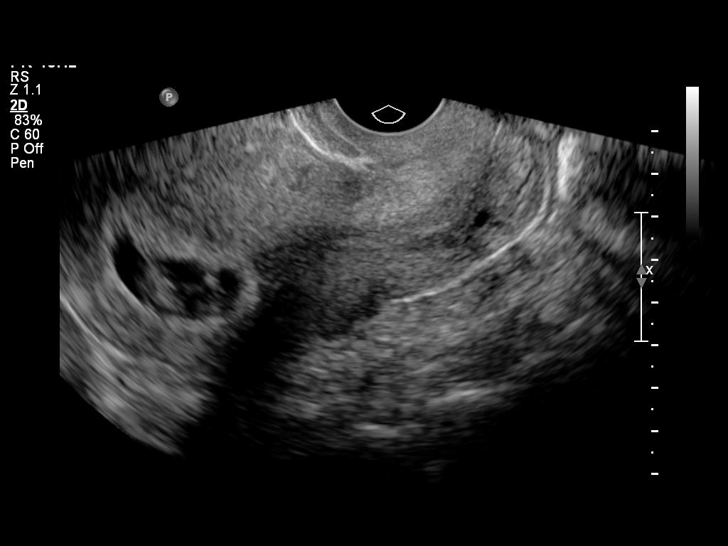
[im 7/36]
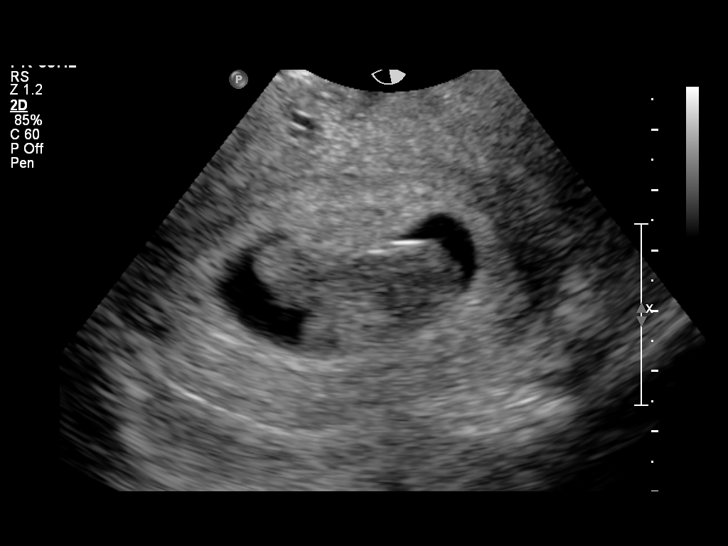
[im 10/36]
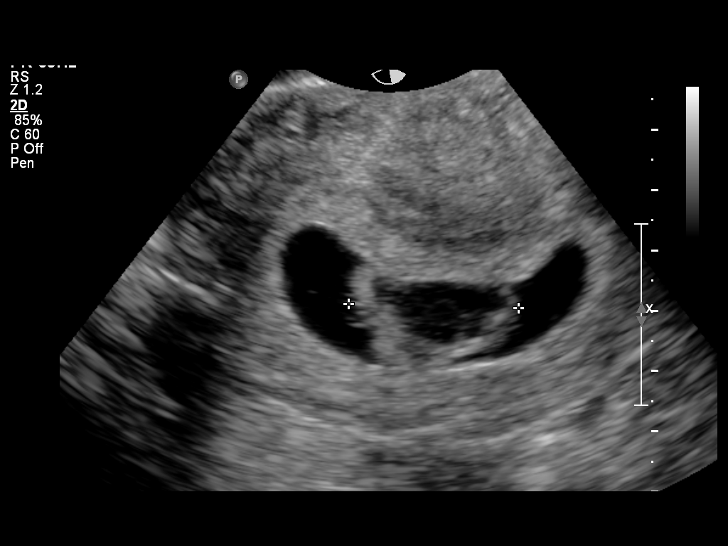
[im 12/36]
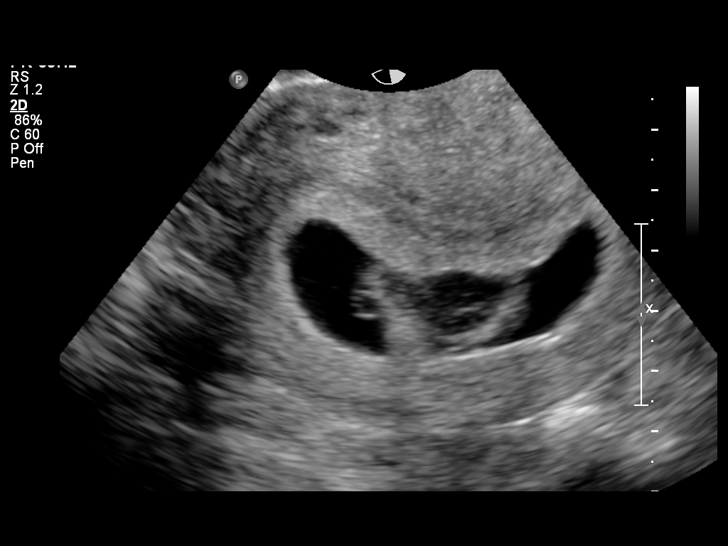
[im 15/36]
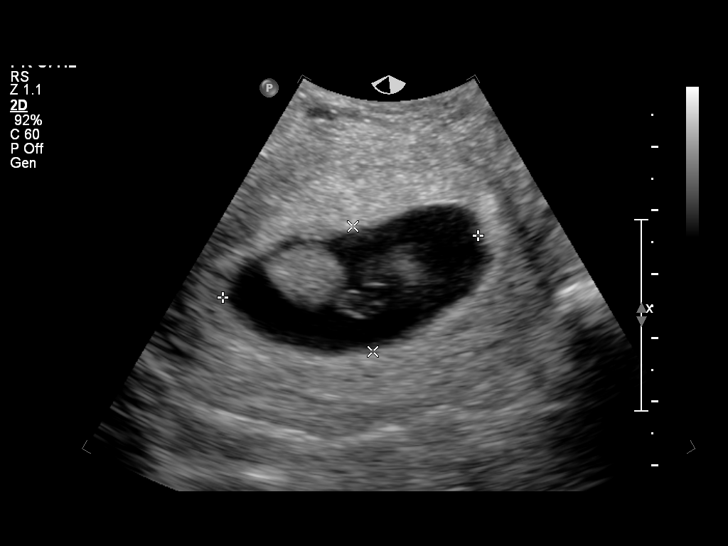
[im 19/36]
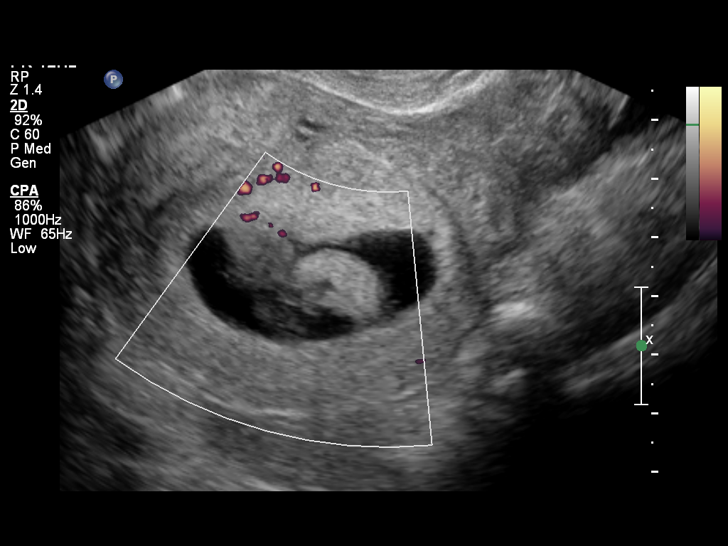
[im 21/36]
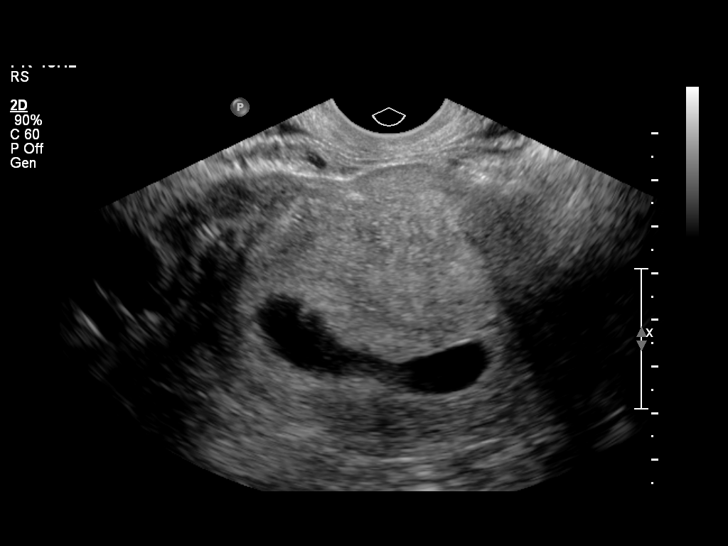
[im 24/36]
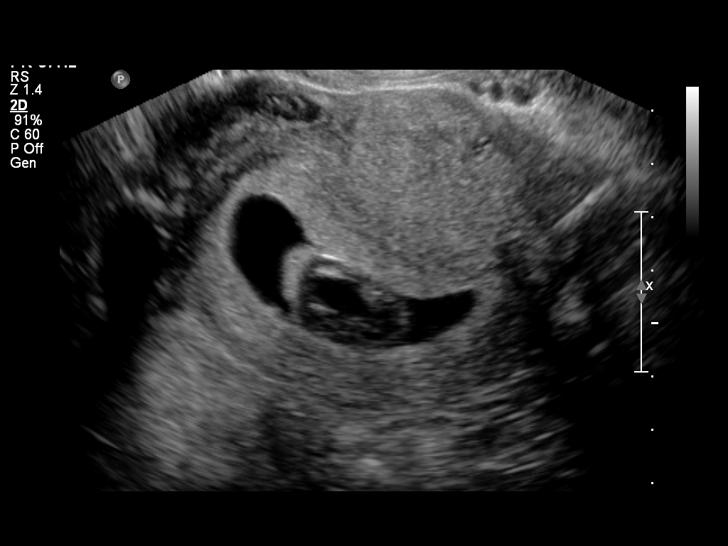
[im 26/36]
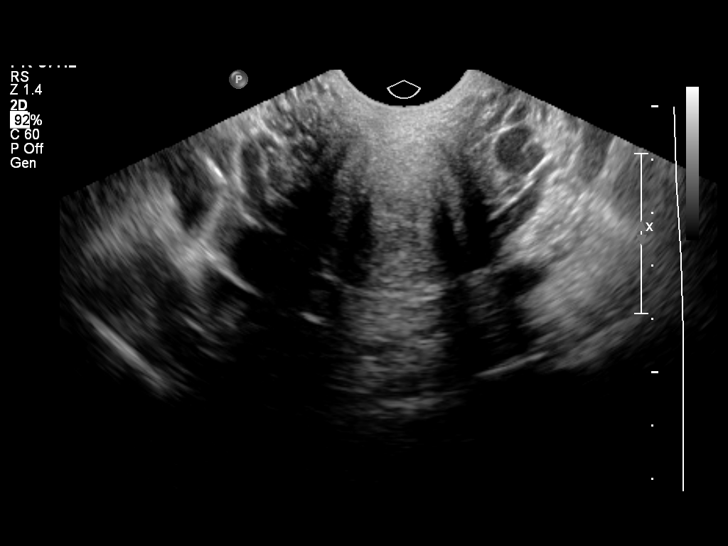
[im 29/36]
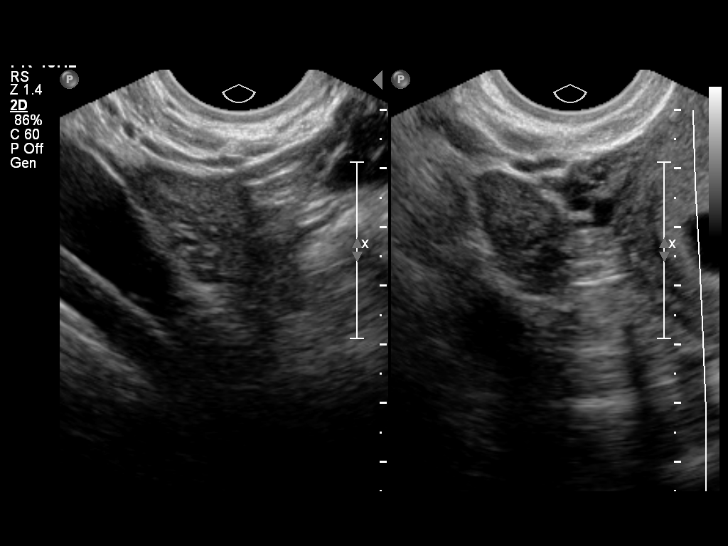
[im 32/36]
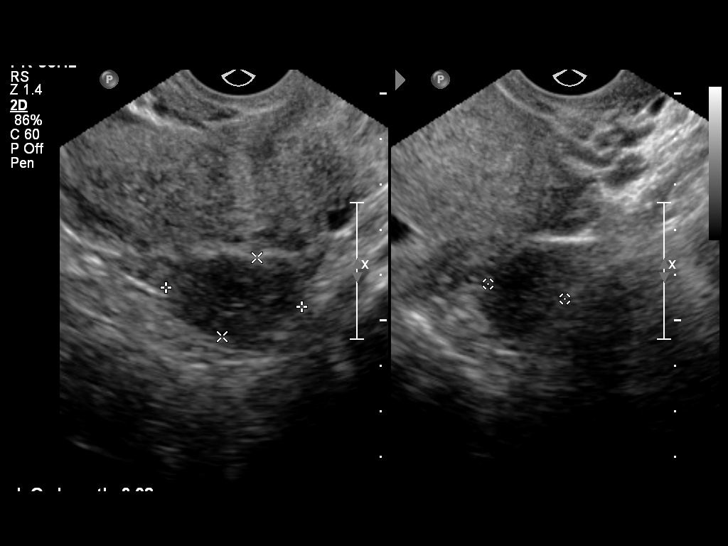
[im 34/36]
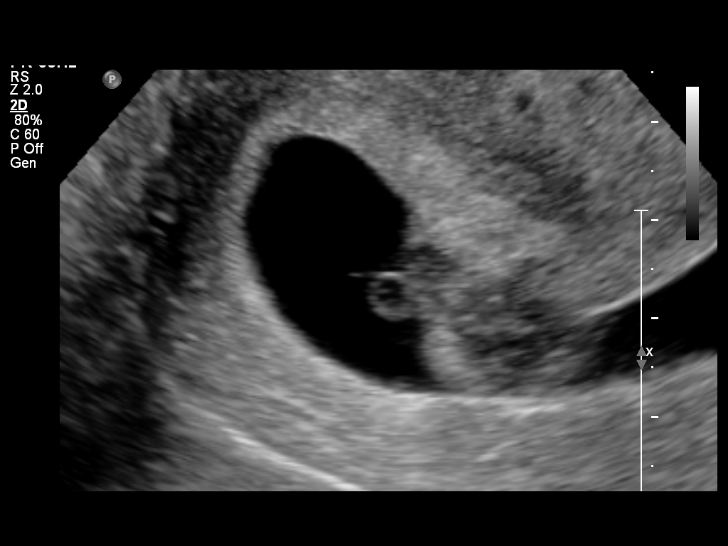

[13 of 28 positions shown; findings below may reference images not displayed]

FINDINGS: Intrauterine gestational sac: Present

Yolk sac:  Not definitely seen

Embryo: Solid and cystic structure within the gestational sac
measures 3.5 x 1.7 x 2.8 cm. Normal embryonic anatomy is absent.

Cardiac Activity: None

MSD: 39  mm   9 w   4  d

Maternal uterus/adnexae: The ovaries have a normal appearance. No
theca lutein cysts identified. No cystic degeneration of the
placenta.
IMPRESSION: 1. Findings are inconsistent with a normal viable pregnancy.
2. There remains the question of molar pregnancy given the abnormal
appearance of the structure within the gestational sac and the lack
of normal embryonic anatomy.
3. Consider karyotype if D and C is performed. Subsequent followup
quantitative beta hCGs would be recommended if positive for molar
pregnancy.
The findings were discussed with JOHNNY GERMAN OCHICA on 11/22/2013 at [DATE].
